# Patient Record
Sex: Female | Born: 1940 | Race: White | Hispanic: No | Marital: Married | Smoking: Never smoker
Health system: Southern US, Community
[De-identification: ages and names within clinical notes are randomized; demographics above are authoritative.]

## PROBLEM LIST (undated history)

## (undated) DIAGNOSIS — F0390 Unspecified dementia without behavioral disturbance: Secondary | ICD-10-CM

## (undated) DIAGNOSIS — I10 Essential (primary) hypertension: Secondary | ICD-10-CM

## (undated) DIAGNOSIS — E785 Hyperlipidemia, unspecified: Secondary | ICD-10-CM

## (undated) DIAGNOSIS — N83209 Unspecified ovarian cyst, unspecified side: Secondary | ICD-10-CM

## (undated) DIAGNOSIS — R945 Abnormal results of liver function studies: Secondary | ICD-10-CM

## (undated) DIAGNOSIS — K219 Gastro-esophageal reflux disease without esophagitis: Secondary | ICD-10-CM

## (undated) DIAGNOSIS — F419 Anxiety disorder, unspecified: Secondary | ICD-10-CM

## (undated) DIAGNOSIS — R51 Headache: Secondary | ICD-10-CM

## (undated) DIAGNOSIS — G2581 Restless legs syndrome: Secondary | ICD-10-CM

## (undated) DIAGNOSIS — R519 Headache, unspecified: Secondary | ICD-10-CM

## (undated) HISTORY — PX: FOOT SURGERY: SHX648

## (undated) HISTORY — DX: Anxiety disorder, unspecified: F41.9

## (undated) HISTORY — DX: Restless legs syndrome: G25.81

## (undated) HISTORY — PX: TUBAL LIGATION: SHX77

## (undated) HISTORY — DX: Gastro-esophageal reflux disease without esophagitis: K21.9

## (undated) HISTORY — DX: Abnormal results of liver function studies: R94.5

## (undated) HISTORY — DX: Headache, unspecified: R51.9

## (undated) HISTORY — DX: Headache: R51

## (undated) HISTORY — DX: Unspecified ovarian cyst, unspecified side: N83.209

## (undated) HISTORY — DX: Hyperlipidemia, unspecified: E78.5

---

## 1999-03-20 ENCOUNTER — Other Ambulatory Visit: Admission: RE | Admit: 1999-03-20 | Discharge: 1999-03-20 | Payer: Self-pay | Admitting: Obstetrics and Gynecology

## 1999-12-24 ENCOUNTER — Encounter: Payer: Self-pay | Admitting: *Deleted

## 1999-12-24 ENCOUNTER — Encounter: Admission: RE | Admit: 1999-12-24 | Discharge: 1999-12-24 | Payer: Self-pay | Admitting: *Deleted

## 2000-02-02 ENCOUNTER — Other Ambulatory Visit: Admission: RE | Admit: 2000-02-02 | Discharge: 2000-02-02 | Payer: Self-pay | Admitting: Obstetrics and Gynecology

## 2000-02-02 ENCOUNTER — Encounter (INDEPENDENT_AMBULATORY_CARE_PROVIDER_SITE_OTHER): Payer: Self-pay

## 2000-02-03 ENCOUNTER — Encounter: Admission: RE | Admit: 2000-02-03 | Discharge: 2000-05-03 | Payer: Self-pay | Admitting: *Deleted

## 2001-02-23 ENCOUNTER — Other Ambulatory Visit: Admission: RE | Admit: 2001-02-23 | Discharge: 2001-02-23 | Payer: Self-pay | Admitting: Obstetrics and Gynecology

## 2001-02-25 ENCOUNTER — Encounter: Payer: Self-pay | Admitting: Obstetrics and Gynecology

## 2001-02-25 ENCOUNTER — Encounter: Admission: RE | Admit: 2001-02-25 | Discharge: 2001-02-25 | Payer: Self-pay | Admitting: Obstetrics and Gynecology

## 2001-04-01 ENCOUNTER — Emergency Department (HOSPITAL_COMMUNITY): Admission: EM | Admit: 2001-04-01 | Discharge: 2001-04-01 | Payer: Self-pay | Admitting: Emergency Medicine

## 2002-03-06 ENCOUNTER — Other Ambulatory Visit: Admission: RE | Admit: 2002-03-06 | Discharge: 2002-03-06 | Payer: Self-pay | Admitting: Obstetrics and Gynecology

## 2002-04-11 ENCOUNTER — Encounter: Payer: Self-pay | Admitting: Orthopedic Surgery

## 2002-04-11 ENCOUNTER — Encounter: Admission: RE | Admit: 2002-04-11 | Discharge: 2002-04-11 | Payer: Self-pay | Admitting: Orthopedic Surgery

## 2003-02-27 ENCOUNTER — Encounter: Payer: Self-pay | Admitting: Internal Medicine

## 2003-02-27 ENCOUNTER — Encounter: Admission: RE | Admit: 2003-02-27 | Discharge: 2003-02-27 | Payer: Self-pay | Admitting: Internal Medicine

## 2003-11-18 ENCOUNTER — Emergency Department (HOSPITAL_COMMUNITY): Admission: EM | Admit: 2003-11-18 | Discharge: 2003-11-18 | Payer: Self-pay | Admitting: Family Medicine

## 2003-11-23 ENCOUNTER — Ambulatory Visit (HOSPITAL_BASED_OUTPATIENT_CLINIC_OR_DEPARTMENT_OTHER): Admission: RE | Admit: 2003-11-23 | Discharge: 2003-11-23 | Payer: Self-pay | Admitting: Orthopedic Surgery

## 2004-03-04 ENCOUNTER — Other Ambulatory Visit: Admission: RE | Admit: 2004-03-04 | Discharge: 2004-03-04 | Payer: Self-pay | Admitting: Internal Medicine

## 2004-06-11 ENCOUNTER — Ambulatory Visit: Payer: Self-pay | Admitting: Internal Medicine

## 2005-04-17 ENCOUNTER — Ambulatory Visit: Payer: Self-pay | Admitting: Internal Medicine

## 2005-04-20 ENCOUNTER — Ambulatory Visit: Payer: Self-pay | Admitting: Family Medicine

## 2005-04-24 ENCOUNTER — Ambulatory Visit: Payer: Self-pay | Admitting: Internal Medicine

## 2005-06-10 ENCOUNTER — Ambulatory Visit: Payer: Self-pay | Admitting: Internal Medicine

## 2005-06-15 ENCOUNTER — Ambulatory Visit: Payer: Self-pay | Admitting: Internal Medicine

## 2005-08-11 ENCOUNTER — Encounter: Admission: RE | Admit: 2005-08-11 | Discharge: 2005-08-11 | Payer: Self-pay | Admitting: Internal Medicine

## 2006-02-18 ENCOUNTER — Ambulatory Visit: Payer: Self-pay | Admitting: Internal Medicine

## 2006-02-24 ENCOUNTER — Ambulatory Visit: Payer: Self-pay | Admitting: Internal Medicine

## 2006-04-27 ENCOUNTER — Ambulatory Visit: Payer: Self-pay | Admitting: Internal Medicine

## 2006-05-17 ENCOUNTER — Ambulatory Visit: Payer: Self-pay | Admitting: Internal Medicine

## 2006-08-10 ENCOUNTER — Ambulatory Visit: Payer: Self-pay | Admitting: Internal Medicine

## 2006-08-10 LAB — CONVERTED CEMR LAB
BUN: 17 mg/dL (ref 6–23)
CO2: 30 meq/L (ref 19–32)
Chloride: 105 meq/L (ref 96–112)
Creatinine, Ser: 0.9 mg/dL (ref 0.4–1.2)
HCT: 39 % (ref 36.0–46.0)
RDW: 12.7 % (ref 11.5–14.6)
WBC: 5.6 10*3/uL (ref 4.5–10.5)

## 2006-08-12 ENCOUNTER — Encounter: Admission: RE | Admit: 2006-08-12 | Discharge: 2006-08-12 | Payer: Self-pay | Admitting: Internal Medicine

## 2006-08-30 ENCOUNTER — Ambulatory Visit: Payer: Self-pay | Admitting: Internal Medicine

## 2006-11-16 ENCOUNTER — Ambulatory Visit: Payer: Self-pay | Admitting: Internal Medicine

## 2006-11-16 LAB — CONVERTED CEMR LAB
Cholesterol: 184 mg/dL (ref 0–200)
HDL: 77.6 mg/dL (ref >39.0)
LDL Cholesterol: 88 mg/dL (ref 0–99)
Total CHOL/HDL Ratio: 2.4
Triglycerides: 93 mg/dL (ref 0–149)
VLDL: 19 mg/dL (ref 0–40)

## 2007-03-23 ENCOUNTER — Telehealth: Payer: Self-pay | Admitting: Internal Medicine

## 2007-04-18 DIAGNOSIS — R51 Headache: Secondary | ICD-10-CM

## 2007-04-18 DIAGNOSIS — R519 Headache, unspecified: Secondary | ICD-10-CM | POA: Insufficient documentation

## 2007-04-22 ENCOUNTER — Ambulatory Visit: Payer: Self-pay | Admitting: Internal Medicine

## 2007-04-22 DIAGNOSIS — F411 Generalized anxiety disorder: Secondary | ICD-10-CM

## 2007-04-22 DIAGNOSIS — G2581 Restless legs syndrome: Secondary | ICD-10-CM

## 2007-04-22 DIAGNOSIS — E782 Mixed hyperlipidemia: Secondary | ICD-10-CM

## 2007-04-22 LAB — CONVERTED CEMR LAB
Bilirubin Urine: NEGATIVE
Ketones, urine, test strip: NEGATIVE
Protein, U semiquant: NEGATIVE
Specific Gravity, Urine: 1.01
WBC Urine, dipstick: NEGATIVE
pH: 6.5

## 2007-04-25 ENCOUNTER — Encounter: Payer: Self-pay | Admitting: Internal Medicine

## 2007-05-04 LAB — CONVERTED CEMR LAB
ALT: 37 units/L — ABNORMAL HIGH (ref 0–35)
AST: 41 units/L — ABNORMAL HIGH (ref 0–37)
Albumin: 4.1 g/dL (ref 3.5–5.2)
Alkaline Phosphatase: 41 units/L (ref 39–117)
BUN: 15 mg/dL (ref 6–23)
Basophils Relative: 0.9 % (ref 0.0–1.0)
CO2: 32 meq/L (ref 19–32)
Chloride: 108 meq/L (ref 96–112)
Creatinine, Ser: 0.8 mg/dL (ref 0.4–1.2)
Ferritin: 28 ng/mL (ref 10.0–291.0)
HCT: 35.7 % — ABNORMAL LOW (ref 36.0–46.0)
Hemoglobin: 12.1 g/dL (ref 12.0–15.0)
Monocytes Absolute: 0.5 10*3/uL (ref 0.2–0.7)
Monocytes Relative: 10.8 % (ref 3.0–11.0)
Neutrophils Relative %: 58.6 % (ref 43.0–77.0)
Potassium: 5.5 meq/L — ABNORMAL HIGH (ref 3.5–5.1)
RBC: 3.88 M/uL (ref 3.87–5.11)
RDW: 13.1 % (ref 11.5–14.6)
Total Bilirubin: 0.6 mg/dL (ref 0.3–1.2)
Total CHOL/HDL Ratio: 2.5
Total Protein: 6.3 g/dL (ref 6.0–8.3)
Triglycerides: 67 mg/dL (ref 0–149)
VLDL: 13 mg/dL (ref 0–40)

## 2007-05-06 ENCOUNTER — Ambulatory Visit: Payer: Self-pay | Admitting: Internal Medicine

## 2007-05-06 ENCOUNTER — Encounter: Payer: Self-pay | Admitting: Family Medicine

## 2007-05-13 ENCOUNTER — Encounter: Payer: Self-pay | Admitting: Internal Medicine

## 2007-07-28 ENCOUNTER — Ambulatory Visit: Payer: Self-pay | Admitting: Internal Medicine

## 2007-07-28 LAB — CONVERTED CEMR LAB
HCV Ab: NEGATIVE
Hepatitis B Surface Ag: NEGATIVE

## 2007-07-31 LAB — CONVERTED CEMR LAB
BUN: 15 mg/dL (ref 6–23)
Cholesterol: 227 mg/dL (ref 0–200)
Direct LDL: 119.9 mg/dL
GFR calc Af Amer: 92 mL/min
GFR calc non Af Amer: 76 mL/min
HDL: 88.9 mg/dL (ref >39.0)
Potassium: 4.6 meq/L (ref 3.5–5.1)
Sodium: 143 meq/L (ref 135–145)
Total CHOL/HDL Ratio: 2.6
Triglycerides: 60 mg/dL (ref 0–149)
VLDL: 12 mg/dL (ref 0–40)

## 2007-08-04 ENCOUNTER — Ambulatory Visit: Payer: Self-pay | Admitting: Internal Medicine

## 2007-08-04 DIAGNOSIS — K219 Gastro-esophageal reflux disease without esophagitis: Secondary | ICD-10-CM

## 2007-08-04 DIAGNOSIS — R7309 Other abnormal glucose: Secondary | ICD-10-CM

## 2007-08-04 DIAGNOSIS — R945 Abnormal results of liver function studies: Secondary | ICD-10-CM

## 2007-08-31 ENCOUNTER — Encounter: Payer: Self-pay | Admitting: Internal Medicine

## 2007-10-04 ENCOUNTER — Ambulatory Visit: Payer: Self-pay | Admitting: Internal Medicine

## 2007-10-04 DIAGNOSIS — R7401 Elevation of levels of liver transaminase levels: Secondary | ICD-10-CM | POA: Insufficient documentation

## 2007-10-04 DIAGNOSIS — R740 Nonspecific elevation of levels of transaminase and lactic acid dehydrogenase [LDH]: Secondary | ICD-10-CM

## 2007-10-10 LAB — CONVERTED CEMR LAB
ALT: 41 units/L — ABNORMAL HIGH (ref 0–35)
AST: 39 units/L — ABNORMAL HIGH (ref 0–37)
Bilirubin, Direct: 0.1 mg/dL (ref 0.0–0.3)
Total Bilirubin: 0.9 mg/dL (ref 0.3–1.2)

## 2007-10-11 ENCOUNTER — Ambulatory Visit: Payer: Self-pay | Admitting: Internal Medicine

## 2007-10-11 DIAGNOSIS — L538 Other specified erythematous conditions: Secondary | ICD-10-CM | POA: Insufficient documentation

## 2007-11-14 ENCOUNTER — Encounter: Admission: RE | Admit: 2007-11-14 | Discharge: 2007-11-14 | Payer: Self-pay | Admitting: Internal Medicine

## 2007-11-29 ENCOUNTER — Ambulatory Visit: Payer: Self-pay | Admitting: Internal Medicine

## 2007-11-29 LAB — CONVERTED CEMR LAB: Ceruloplasmin: 37 mg/dL (ref 21–63)

## 2007-12-05 LAB — CONVERTED CEMR LAB
AST: 33 units/L (ref 0–37)
Albumin: 4 g/dL (ref 3.5–5.2)
Ferritin: 20.3 ng/mL (ref 10.0–291.0)
Iron: 114 ug/dL (ref 42–145)
Total Bilirubin: 0.7 mg/dL (ref 0.3–1.2)
Transferrin: 316.9 mg/dL (ref >=212.0)

## 2007-12-06 ENCOUNTER — Ambulatory Visit: Payer: Self-pay | Admitting: Internal Medicine

## 2008-01-06 ENCOUNTER — Telehealth: Payer: Self-pay | Admitting: *Deleted

## 2008-02-22 ENCOUNTER — Ambulatory Visit: Payer: Self-pay | Admitting: Internal Medicine

## 2008-02-24 LAB — CONVERTED CEMR LAB
AST: 34 units/L (ref 0–37)
Bilirubin, Direct: 0.1 mg/dL (ref 0.0–0.3)
Direct LDL: 119.5 mg/dL
HDL: 71.5 mg/dL (ref >39.0)
Total Bilirubin: 0.7 mg/dL (ref 0.3–1.2)
Total CHOL/HDL Ratio: 3.1
Triglycerides: 105 mg/dL (ref 0–149)

## 2008-03-10 ENCOUNTER — Ambulatory Visit: Payer: Self-pay | Admitting: Family Medicine

## 2008-04-09 ENCOUNTER — Telehealth: Payer: Self-pay | Admitting: Internal Medicine

## 2008-05-16 ENCOUNTER — Encounter: Payer: Self-pay | Admitting: Internal Medicine

## 2008-05-22 ENCOUNTER — Telehealth: Payer: Self-pay | Admitting: Internal Medicine

## 2008-05-24 ENCOUNTER — Telehealth: Payer: Self-pay | Admitting: Internal Medicine

## 2008-06-12 ENCOUNTER — Telehealth: Payer: Self-pay | Admitting: Internal Medicine

## 2008-07-02 ENCOUNTER — Ambulatory Visit: Payer: Self-pay | Admitting: Internal Medicine

## 2008-07-23 ENCOUNTER — Telehealth: Payer: Self-pay | Admitting: Internal Medicine

## 2008-08-31 ENCOUNTER — Telehealth: Payer: Self-pay | Admitting: Internal Medicine

## 2008-09-03 ENCOUNTER — Encounter: Payer: Self-pay | Admitting: Internal Medicine

## 2008-09-05 ENCOUNTER — Encounter: Payer: Self-pay | Admitting: Internal Medicine

## 2008-10-12 ENCOUNTER — Ambulatory Visit: Payer: Self-pay | Admitting: Internal Medicine

## 2008-10-15 LAB — CONVERTED CEMR LAB
AST: 36 units/L (ref 0–37)
Alkaline Phosphatase: 47 units/L (ref 39–117)
BUN: 14 mg/dL (ref 6–23)
Basophils Relative: 0.7 % (ref 0.0–3.0)
Bilirubin, Direct: 0 mg/dL (ref 0.0–0.3)
CO2: 31 meq/L (ref 19–32)
Calcium: 9.4 mg/dL (ref 8.4–10.5)
Chloride: 106 meq/L (ref 96–112)
Creatinine, Ser: 0.9 mg/dL (ref 0.4–1.2)
Direct LDL: 117.4 mg/dL
Eosinophils Relative: 5.2 % — ABNORMAL HIGH (ref 0.0–5.0)
Glucose, Bld: 99 mg/dL (ref 70–99)
HCT: 38.6 % (ref 36.0–46.0)
Hemoglobin: 13.1 g/dL (ref 12.0–15.0)
Lymphs Abs: 1 10*3/uL (ref 0.7–4.0)
Monocytes Relative: 11.8 % (ref 3.0–12.0)
Neutro Abs: 2.2 10*3/uL (ref 1.4–7.7)
Platelets: 298 10*3/uL (ref 150.0–400.0)
RBC: 4.14 M/uL (ref 3.87–5.11)
TSH: 1.24 microintl units/mL (ref 0.35–5.50)
Total CHOL/HDL Ratio: 2
WBC: 3.8 10*3/uL — ABNORMAL LOW (ref 4.5–10.5)

## 2009-01-02 ENCOUNTER — Encounter: Payer: Self-pay | Admitting: Internal Medicine

## 2009-03-28 ENCOUNTER — Ambulatory Visit: Payer: Self-pay | Admitting: Internal Medicine

## 2009-03-28 DIAGNOSIS — M7989 Other specified soft tissue disorders: Secondary | ICD-10-CM

## 2009-04-10 ENCOUNTER — Telehealth: Payer: Self-pay | Admitting: Internal Medicine

## 2009-04-17 ENCOUNTER — Ambulatory Visit: Payer: Self-pay | Admitting: Internal Medicine

## 2009-04-17 LAB — CONVERTED CEMR LAB
ALT: 43 units/L — ABNORMAL HIGH (ref 0–35)
AST: 37 units/L (ref 0–37)
Albumin: 4 g/dL (ref 3.5–5.2)
Alkaline Phosphatase: 48 units/L (ref 39–117)

## 2009-04-24 ENCOUNTER — Encounter: Payer: Self-pay | Admitting: Internal Medicine

## 2009-04-24 ENCOUNTER — Other Ambulatory Visit: Admission: RE | Admit: 2009-04-24 | Discharge: 2009-04-24 | Payer: Self-pay | Admitting: Internal Medicine

## 2009-04-24 ENCOUNTER — Ambulatory Visit: Payer: Self-pay | Admitting: Internal Medicine

## 2009-05-23 ENCOUNTER — Ambulatory Visit: Payer: Self-pay | Admitting: Internal Medicine

## 2009-08-12 ENCOUNTER — Encounter: Payer: Self-pay | Admitting: Internal Medicine

## 2009-08-26 ENCOUNTER — Encounter: Payer: Self-pay | Admitting: Internal Medicine

## 2009-08-26 ENCOUNTER — Ambulatory Visit: Payer: Self-pay | Admitting: Internal Medicine

## 2009-09-12 ENCOUNTER — Encounter: Payer: Self-pay | Admitting: Internal Medicine

## 2009-09-18 ENCOUNTER — Ambulatory Visit: Payer: Self-pay | Admitting: Internal Medicine

## 2009-09-18 LAB — CONVERTED CEMR LAB
AST: 34 units/L (ref 0–37)
Albumin: 4 g/dL (ref 3.5–5.2)

## 2009-09-25 ENCOUNTER — Ambulatory Visit: Payer: Self-pay | Admitting: Internal Medicine

## 2009-10-03 ENCOUNTER — Telehealth: Payer: Self-pay | Admitting: Internal Medicine

## 2009-12-23 ENCOUNTER — Telehealth: Payer: Self-pay | Admitting: Internal Medicine

## 2010-03-26 ENCOUNTER — Ambulatory Visit: Payer: Self-pay | Admitting: Internal Medicine

## 2010-03-26 LAB — CONVERTED CEMR LAB
ALT: 30 units/L (ref 0–35)
BUN: 18 mg/dL (ref 6–23)
CO2: 30 meq/L (ref 19–32)
Chloride: 103 meq/L (ref 96–112)
Cholesterol: 251 mg/dL — ABNORMAL HIGH (ref 0–200)
Creatinine, Ser: 0.8 mg/dL (ref 0.4–1.2)
Direct LDL: 154.1 mg/dL
TSH: 2.34 microintl units/mL (ref 0.35–5.50)
Total Bilirubin: 0.7 mg/dL (ref 0.3–1.2)
Total Protein: 6.4 g/dL (ref 6.0–8.3)
Triglycerides: 107 mg/dL (ref 0.0–149.0)
VLDL: 21.4 mg/dL (ref 0.0–40.0)

## 2010-04-02 ENCOUNTER — Ambulatory Visit: Payer: Self-pay | Admitting: Internal Medicine

## 2010-06-04 ENCOUNTER — Ambulatory Visit: Payer: Self-pay | Admitting: Internal Medicine

## 2010-06-06 LAB — CONVERTED CEMR LAB
ALT: 32 units/L (ref 0–35)
Alkaline Phosphatase: 39 units/L (ref 39–117)
Bilirubin, Direct: 0.1 mg/dL (ref 0.0–0.3)
Total Protein: 6 g/dL (ref 6.0–8.3)

## 2010-08-19 NOTE — Progress Notes (Signed)
Summary: tongue lumpy  Phone Note Call from Patient Call back at Home Phone [phone]   Caller: Patient Summary of Call: Pt called saying that on 5/31 or 6/1 she bit her tongue and it swelled up. Then she did it again. It is healing in a odd lumpy way. She is wanting to know what she should do. It is still swollen but it looks like it is healing in a funny way. She states that her teeth went into the top layer of her tongue. It bleed some. It is tender.  Initial call taken by: Romualdo Bolk, CMA Duncan Dull),  December 23, 2009 12:40 PM  Follow-up for Phone Call        Per- Dr. Fabian Sharp- have her call her dentist to see if they can see her otherwise can see her tomorrow. Follow-up by: Romualdo Bolk, CMA (AAMA),  December 23, 2009 12:43 PM

## 2010-08-19 NOTE — Assessment & Plan Note (Signed)
Summary: fup labs//ccm   Vital Signs:  Patient profile:   70 year old female Menstrual status:  postmenopausal Weight:      141 pounds Pulse rate:   72 / minute BP sitting:   112 / 70  (left arm) Cuff size:   regular  Vitals Entered By: Romualdo Bolk, CMA (AAMA) (April 02, 2010 9:44 AM) CC: Follow-up visit on labs   History of Present Illness: Cynthia Floyd comes in today  for follow up of multiple medical problems . Since last visit  here  there have been no major changes in health status  .  LIPIDS: NO se of meds  could eat a bit better  Allergies :  stable   Mood/ anxiety :  now taking 30 mg   celexa for few weeks.    and doing better .   less chest pressure . needs refillof tranxene only using as needed.  GERD: stable   Preventive Screening-Counseling & Management  Alcohol-Tobacco     Alcohol drinks/day: <1     Smoking Status: never  Caffeine-Diet-Exercise     Caffeine use/day: 4     Does Patient Exercise: no  Current Medications (verified): 1)  Clorazepate Dipotassium 7.5 Mg Tabs (Clorazepate Dipotassium) .Marland Kitchen.. 1-2 By Mouth Once Daily As Needed 2)  Fluticasone Propionate 50 Mcg/act Susp (Fluticasone Propionate) .Marland Kitchen.. 1-2 Sprays in Each Nostril Daily 3)  Protonix 40 Mg  Tbec (Pantoprazole Sodium) .Marland Kitchen.. 1 By Mouth Once Daily Prn 4)  Mevacor 20 Mg  Tabs (Lovastatin) .Marland Kitchen.. 1 By Mouth Once Daily 5)  Citalopram Hydrobromide 10 Mg Tabs (Citalopram Hydrobromide) .... 2 By Mouth Once Daily But Has Increased To 3 Once Daily  Allergies (verified): 1)  Aleve (Naproxen Sodium) 2)  Aspirin (Aspirin) 3)  * Bextra (Valdecoxib) 4)  Ibuprofen (Ibuprofen)  Past History:  Past medical, surgical, family and social histories (including risk factors) reviewed, and no changes noted (except as noted below).  Past Medical History: Reviewed history from 10/12/2008 and no changes required. High cholesterol Headaches Anxiety RLS Hyperlipidemia GERD  REmote hx of ovarian cyst.     MRI Head 1/08  and wnl  DT 2004 Dexa 2006  EKG 2006  Past Surgical History: Reviewed history from 10/12/2008 and no changes required. CB x2 Bil foot Surgery Colonoscopy 2003 Tubal ligation  Past History:  Care Management: Psychiatry: Nolen Mu  Orthopedics: Madelon Lips  Dermatology: Venancio Poisson   Family History: Reviewed history from 07/02/2008 and no changes required. Family History of Arthritis Family History Diabetes 1st degree relative FA Family History Hypertension Family History Kidney disease Family History of Melanoma Family History Other cancer - Pancreatic MOM Family History of Cardiovascular disorder FA Family History High cholesterol  neg for liver disease   Social History: Reviewed history from 10/12/2008 and no changes required. Retired Married Never Smoked Alcohol use-yes   2 night    Drug use-no Regular exercise-no  cats  Review of Systems  The patient denies anorexia, fever, weight loss, weight gain, vision loss, decreased hearing, hoarseness, syncope, dyspnea on exertion, peripheral edema, prolonged cough, abdominal pain, severe indigestion/heartburn, transient blindness, difficulty walking, depression, abnormal bleeding, enlarged lymph nodes, and angioedema.         ocass chest pressure felt to be anxiety  better with celexa   Physical Exam  General:  Well-developed,well-nourished,in no acute distress; alert,appropriate and cooperative throughout examination Head:  normocephalic and atraumatic.   Eyes:  vision grossly intact, pupils equal, and pupils round.   Ears:  no  external deformities.   Neck:  No deformities, masses, or tenderness noted. Lungs:  Normal respiratory effort, chest expands symmetrically. Lungs are clear to auscultation, no crackles or wheezes. Heart:  Normal rate and regular rhythm. S1 and S2 normal without gallop, murmur, click, rub or other extra sounds. Abdomen:  Bowel sounds positive,abdomen soft and non-tender without  masses, organomegaly or   noted. Msk:  no joint warmth and no redness over joints.   Pulses:  pulses intact without delay   Extremities:  no clubbing cyanosis or edema  Neurologic:  Pt is A&Ox3,affect,speech,memory,attention,&motor skills appear intact.  Skin:  turgor normal and color normal.   Cervical Nodes:  No lymphadenopathy noted Psych:  Oriented X3, good eye contact, and not depressed appearing.  not anxious appearing . nl cognition   Impression & Recommendations:  Problem # 1:  HYPERLIPIDEMIA (ICD-272.2)  could be better  no se of meds  ok to give a trial of higher dose if tolerates .  ratio is pretty good.  Her updated medication list for this problem includes:    Mevacor 20 Mg Tabs (Lovastatin) .Marland Kitchen... 1 by mouth once daily    Lovastatin 40 Mg Tabs (Lovastatin) .Marland Kitchen... Take   1 by mouth once daily or as directed  Labs Reviewed: SGOT: 32 (03/26/2010)   SGPT: 30 (03/26/2010)  Lipid Goals: Chol Goal: 200 (04/22/2007)   HDL Goal: 40 (04/22/2007)   LDL Goal: 160 (04/22/2007)   TG Goal: 150 (04/22/2007)  Prior 10 Yr Risk Heart Disease: Not enough information (10/12/2008)   HDL:83.90 (03/26/2010), 97.30 (10/12/2008)  LDL:DEL (02/22/2008), DEL (07/28/2007)  Chol:251 (03/26/2010), 219 (10/12/2008)  Trig:107.0 (03/26/2010), 46.0 (10/12/2008)  Orders: Prescription Created Electronically [phone])  Problem # 2:  ANXIETY STATE NOS (ICD-300.00) better on  higher dose  celexa  managing Her updated medication list for this problem includes:    Clorazepate Dipotassium 7.5 Mg Tabs (Clorazepate dipotassium) .Marland Kitchen... 1-2 by mouth once daily as needed    Citalopram Hydrobromide 10 Mg Tabs (Citalopram hydrobromide) .Marland KitchenMarland KitchenMarland KitchenMarland Kitchen 3 by mouth once daily or as directed  Problem # 3:  HYPERGLYCEMIA (ICD-790.29) Assessment: Improved normal  Problem # 4:  FATTY LIVER DISEASE ON [place] 4/09 (ICD-571.8) lfts normal now.   Complete Medication List: 1)  Clorazepate Dipotassium 7.5 Mg Tabs (Clorazepate dipotassium)  .Marland Kitchen.. 1-2 by mouth once daily as needed 2)  Fluticasone Propionate 50 Mcg/act Susp (Fluticasone propionate) .Marland Kitchen.. 1-2 sprays in each nostril daily 3)  Protonix 40 Mg Tbec (Pantoprazole sodium) .Marland Kitchen.. 1 by mouth once daily prn 4)  Mevacor 20 Mg Tabs (Lovastatin) .Marland Kitchen.. 1 by mouth once daily 5)  Citalopram Hydrobromide 10 Mg Tabs (Citalopram hydrobromide) .... 3 by mouth once daily or as directed 6)  Lovastatin 40 Mg Tabs (Lovastatin) .... Take   1 by mouth once daily or as directed  Other Orders: Admin 1st Vaccine (29562) Flu Vaccine 7yrs + (13086)  Patient Instructions: 1)  increase celexa to 30 mg  per day 2)  inc lovastatin to 40 per day  3)  lipids liver in 2-3 months of ik no ov needed. dx 272.4 4)  Medicare wellness visit in 6 months    5)  call for refills in the meantime Prescriptions: LOVASTATIN 40 MG TABS (LOVASTATIN) take   1 by mouth once daily or as directed  #30 x 6   Entered and Authorized by:   Madelin Headings MD   Signed by:   Madelin Headings MD on 04/02/2010   Method used:  Electronically to        [facility] [phone]* (retail)       [address]       [place], [place]  [address]       Ph: [phone] or [phone]       Fax: [phone]   RxID:   [id number] CLORAZEPATE DIPOTASSIUM 7.5 MG TABS (CLORAZEPATE DIPOTASSIUM) 1-2 by mouth once daily as needed  #60 x 1   Entered and Authorized by:   Madelin Headings MD   Signed by:   Madelin Headings MD on 04/02/2010   Method used:   Print then Give to Patient   RxID:   [id number] CITALOPRAM HYDROBROMIDE 10 MG TABS (CITALOPRAM HYDROBROMIDE) 3 by mouth once daily or as directed  #90 x 5   Entered and Authorized by:   Madelin Headings MD   Signed by:   Madelin Headings MD on 04/02/2010   Method used:   Electronically to        [facility] [phone]* (retail)       [address]       [place], [place]  [address]       Ph: [phone] or [phone]       Fax: [phone]   RxID:   [id number]  Flu Vaccine Consent  Questions     Do you have a history of severe allergic reactions to this vaccine? no    Any prior history of allergic reactions to egg and/or gelatin? no    Do you have a sensitivity to the preservative Thimersol? no    Do you have a past history of Guillan-Barre Syndrome? no    Do you currently have an acute febrile illness? no    Have you ever had a severe reaction to latex? no    Vaccine information given and explained to patient? yes    Are you currently pregnant? no    Lot Number:[id number]   Exp Date:01/17/2011   Site Given  Left Deltoid IMlu Romualdo Bolk, CMA (AAMA)  April 02, 2010 9:49 AM

## 2010-08-19 NOTE — Assessment & Plan Note (Signed)
Summary: 4 MNTH ROV//SLM   Vital Signs:  Patient profile:   70 year old female Menstrual status:  postmenopausal Height:      65.5 inches Weight:      147 pounds BMI:     24.18 Pulse rate:   78 / minute BP sitting:   120 / 80  (left arm) Cuff size:   regular  Vitals Entered By: Romualdo Bolk, CMA (AAMA) (September 25, 2009 9:59 AM) CC: Follow-up visit on labs   History of Present Illness: Cynthia Floyd comesin today for   for  above   lab  and meds and ongoing medical conditions.  Since last visit she has done pretty well.  Mood  :   anxiety.    has increase to 2 by mouth once daily   taking as needed  chlorazepate.    controlled.  Mammo and dexa are good.  No other change in health status  traveling with family obligations without sig problem  Liver:  NO excess alcohol or meds of tylenol or nsaids. LIPIDs;NO se of meds  Granuloma annulare  reasonably controlled .     Preventive Screening-Counseling & Management  Alcohol-Tobacco     Alcohol drinks/day: <1     Smoking Status: never  Caffeine-Diet-Exercise     Caffeine use/day: 4     Does Patient Exercise: no  Current Medications (verified): 1)  Clorazepate Dipotassium 7.5 Mg Tabs (Clorazepate Dipotassium) .Marland Kitchen.. 1-2 By Mouth Once Daily As Needed 2)  Fluticasone Propionate 50 Mcg/act Susp (Fluticasone Propionate) .Marland Kitchen.. 1-2 Sprays in Each Nostril Daily 3)  Protonix 40 Mg  Tbec (Pantoprazole Sodium) .Marland Kitchen.. 1 By Mouth Once Daily Prn 4)  Mevacor 20 Mg  Tabs (Lovastatin) .Marland Kitchen.. 1 By Mouth Once Daily 5)  Citalopram Hydrobromide 10 Mg Tabs (Citalopram Hydrobromide) .Marland Kitchen.. 1 By Mouth Once Daily  Can Increase To 2 By Mouth Once Daily After 2-3 Weeks or As Directed  Allergies (verified): 1)  Aleve (Naproxen Sodium) 2)  Aspirin (Aspirin) 3)  * Bextra (Valdecoxib) 4)  Ibuprofen (Ibuprofen)  Past History:  Past medical, surgical, family and social histories (including risk factors) reviewed, and no changes noted (except as noted  below).  Past Medical History: Reviewed history from 10/12/2008 and no changes required. High cholesterol Headaches Anxiety RLS Hyperlipidemia GERD  REmote hx of ovarian cyst.   MRI Head 1/08  and wnl  DT 2004 Dexa 2006  EKG 2006  Past Surgical History: Reviewed history from 10/12/2008 and no changes required. CB x2 Bil foot Surgery Colonoscopy 2003 Tubal ligation  Past History:  Care Management: Psychiatry: Nolen Mu  Orthopedics: Madelon Lips  Dermatology: Venancio Poisson   Family History: Reviewed history from 07/02/2008 and no changes required. Family History of Arthritis Family History Diabetes 1st degree relative FA Family History Hypertension Family History Kidney disease Family History of Melanoma Family History Other cancer - Pancreatic MOM Family History of Cardiovascular disorder FA Family History High cholesterol  neg for liver disease   Social History: Reviewed history from 10/12/2008 and no changes required. Retired Married Never Smoked Alcohol use-yes   2 night    Drug use-no Regular exercise-no  cats  Review of Systems  The patient denies anorexia, fever, weight loss, weight gain, hoarseness, syncope, dyspnea on exertion, peripheral edema, prolonged cough, headaches, abdominal pain, difficulty walking, depression, abnormal bleeding, enlarged lymph nodes, and angioedema.         rest of ros no change  Physical Exam  General:  Well-developed,well-nourished,in no acute distress; alert,appropriate  and cooperative throughout examination Head:  normocephalic and atraumatic.   Eyes:  vision grossly intact, pupils equal, and pupils round.   Neck:  No deformities, masses, or tenderness noted. Lungs:  Normal respiratory effort, chest expands symmetrically. Lungs are clear to auscultation, no crackles or wheezes.no dullness.   Heart:  Normal rate and regular rhythm. S1 and S2 normal without gallop, murmur, click, rub or other extra sounds.no lifts.     Abdomen:  Bowel sounds positive,abdomen soft and non-tender without masses, organomegaly or   noted. Extremities:  no clubbing cyanosis or edema  Neurologic:  non focal  Skin:  turgor normal and color normal.   Cervical Nodes:  No lymphadenopathy noted Psych:  Oriented X3, good eye contact, and not depressed appearing.  not anxious appearing . nl cognition   Impression & Recommendations:  Problem # 1:  LIVER FUNCTION TESTS, ABNORMAL (ICD-794.8) Assessment Improved will follow  hx of fatty liver on scan  Problem # 2:  HYPERLIPIDEMIA (ICD-272.2)  Her updated medication list for this problem includes:    Mevacor 20 Mg Tabs (Lovastatin) .Marland Kitchen... 1 by mouth once daily  Labs Reviewed: SGOT: 34 (09/18/2009)   SGPT: 33 (09/18/2009)  Lipid Goals: Chol Goal: 200 (04/22/2007)   HDL Goal: 40 (04/22/2007)   LDL Goal: 160 (04/22/2007)   TG Goal: 150 (04/22/2007)  Prior 10 Yr Risk Heart Disease: Not enough information (10/12/2008)   HDL:97.30 (10/12/2008), 71.5 (02/22/2008)  LDL:DEL (02/22/2008), DEL (07/28/2007)  Chol:219 (10/12/2008), 219 (02/22/2008)  Trig:46.0 (10/12/2008), 105 (02/22/2008)  Problem # 3:  ANXIETY STATE NOS (ICD-300.00) Assessment: Unchanged stable ok to stay on hiher dose of med  Her updated medication list for this problem includes:    Clorazepate Dipotassium 7.5 Mg Tabs (Clorazepate dipotassium) .Marland Kitchen... 1-2 by mouth once daily as needed    Citalopram Hydrobromide 10 Mg Tabs (Citalopram hydrobromide) .Marland Kitchen... 1 by mouth once daily  can increase to 2 by mouth once daily after 2-3 weeks or as directed  Complete Medication List: 1)  Clorazepate Dipotassium 7.5 Mg Tabs (Clorazepate dipotassium) .Marland Kitchen.. 1-2 by mouth once daily as needed 2)  Fluticasone Propionate 50 Mcg/act Susp (Fluticasone propionate) .Marland Kitchen.. 1-2 sprays in each nostril daily 3)  Protonix 40 Mg Tbec (Pantoprazole sodium) .Marland Kitchen.. 1 by mouth once daily prn 4)  Mevacor 20 Mg Tabs (Lovastatin) .Marland Kitchen.. 1 by mouth once daily 5)   Citalopram Hydrobromide 10 Mg Tabs (Citalopram hydrobromide) .Marland Kitchen.. 1 by mouth once daily  can increase to 2 by mouth once daily after 2-3 weeks or as directed  Patient Instructions: 1)  Please schedule a follow-up appointment in 6 months .  2)  BMP prior to visit, ICD-9:   272.2  790. 3)  Hepatic Panel prior to visit ICD-9:  4)  Lipid panel prior to visit ICD-9 :  5)  TSH prior to visit ICD-9 :   272.4  Prescriptions: CLORAZEPATE DIPOTASSIUM 7.5 MG TABS (CLORAZEPATE DIPOTASSIUM) 1-2 by mouth once daily as needed  #60 x 1   Entered and Authorized by:   Madelin Headings MD   Signed by:   Madelin Headings MD on 09/25/2009   Method used:   Print then Give to Patient   RxID:   [id number]

## 2010-08-19 NOTE — Medication Information (Signed)
Summary: Rx Coverage Approval/[employer]  Rx Coverage Approval/[employer]   Imported By: Maryln Gottron 08/15/2009 11:32:51  _____________________________________________________________________  External Attachment:    Type:   Image     Comment:   External Document

## 2010-08-19 NOTE — Miscellaneous (Signed)
Summary: BONE DENSITY  Clinical Lists Changes  Orders: Added new Test order of T-Bone Densitometry (77080) - Signed Added new Test order of T-Lumbar Vertebral Assessment (77082) - Signed 

## 2010-08-19 NOTE — Progress Notes (Signed)
Summary: FYI  Phone Note Call from Patient   Caller: Patient Call For: Madelin Headings MD Summary of Call: Pt. is calling for information on HPV vaccination.  Informed it is indiicated for femaies 9-26.  Discussed cervical cancer, and how HPV plays a part in this diagnosis.  Discussed high risk behavior and need for annual pap smears. Initial call taken by: Lynann Beaver CMA,  October 03, 2009 11:52 AM

## 2010-09-08 ENCOUNTER — Other Ambulatory Visit: Payer: Self-pay | Admitting: *Deleted

## 2010-09-08 NOTE — Telephone Encounter (Signed)
Last filled on 06/21/10 #60

## 2010-09-09 NOTE — Telephone Encounter (Signed)
Ok to refill  X 2  

## 2010-09-10 MED ORDER — CLORAZEPATE DIPOTASSIUM 7.5 MG PO TABS: ORAL_TABLET | ORAL | Status: DC

## 2010-09-10 NOTE — Telephone Encounter (Signed)
Rx faxed to pharmacy  

## 2010-10-02 ENCOUNTER — Encounter: Payer: Self-pay | Admitting: Internal Medicine

## 2010-10-06 ENCOUNTER — Ambulatory Visit (INDEPENDENT_AMBULATORY_CARE_PROVIDER_SITE_OTHER): Payer: Medicare Other | Admitting: Internal Medicine

## 2010-10-06 ENCOUNTER — Encounter: Payer: Self-pay | Admitting: Internal Medicine

## 2010-10-06 VITALS — BP 120/80 | HR 72 | Ht 65.0 in | Wt 148.0 lb

## 2010-10-06 DIAGNOSIS — G2581 Restless legs syndrome: Secondary | ICD-10-CM

## 2010-10-06 DIAGNOSIS — Z0000 Encounter for general adult medical examination without abnormal findings: Secondary | ICD-10-CM

## 2010-10-06 DIAGNOSIS — F17200 Nicotine dependence, unspecified, uncomplicated: Secondary | ICD-10-CM

## 2010-10-06 DIAGNOSIS — R7309 Other abnormal glucose: Secondary | ICD-10-CM

## 2010-10-06 DIAGNOSIS — Z136 Encounter for screening for cardiovascular disorders: Secondary | ICD-10-CM

## 2010-10-06 DIAGNOSIS — F411 Generalized anxiety disorder: Secondary | ICD-10-CM

## 2010-10-06 DIAGNOSIS — K219 Gastro-esophageal reflux disease without esophagitis: Secondary | ICD-10-CM

## 2010-10-06 DIAGNOSIS — E782 Mixed hyperlipidemia: Secondary | ICD-10-CM

## 2010-10-06 MED ORDER — FLUTICASONE PROPIONATE 50 MCG/ACT NA SUSP: 2.0000 | Freq: Every day | NASAL | Status: DC

## 2010-10-06 MED ORDER — CLORAZEPATE DIPOTASSIUM 7.5 MG PO TABS: ORAL_TABLET | ORAL | Status: DC

## 2010-10-06 MED ORDER — CITALOPRAM HYDROBROMIDE 10 MG PO TABS: 10.0000 mg | ORAL_TABLET | Freq: Every day | ORAL | Status: DC

## 2010-10-06 MED ORDER — LOVASTATIN 20 MG PO TABS: 20.0000 mg | ORAL_TABLET | Freq: Every day | ORAL | Status: DC

## 2010-10-06 MED ORDER — PANTOPRAZOLE SODIUM 40 MG PO TBEC: 40.0000 mg | DELAYED_RELEASE_TABLET | Freq: Every day | ORAL | Status: DC

## 2010-10-06 NOTE — Patient Instructions (Signed)
lifestyle intervention healthy eating and exercise . To help lipids and weight.

## 2010-10-06 NOTE — Progress Notes (Signed)
Subjective:    Cynthia Floyd is a 70 y.o. female who presents for a wellness Medicare exam. A nd fu of  multiple medical issues  No major change in health status since last visit . BP has been good on no meds  Anxiety up and down but controlled   Takes more meds when has guests.  LIPIDS: no se of meds ? Whether to take 20 or 40 of lovastatin GERD  Takes prilosec Cardiac risk factors: advanced age (older than 45 for men, 62 for women) and dyslipidemia.  Activities of Daily Living  In your present state of health, do you have any difficulty performing the following activities?:  Preparing food and eating?: No Bathing yourself: No Getting dressed: No Using the toilet:No Moving around from place to place: No In the past year have you fallen or had a near fall?:No  Current exercise habits: Home exercise routine includes treadmill, walking 1 hrs per day and Yard work.   Dietary issues discussed: gained weight   Eating   diffenerent . Likes ice cream  Hearing: little tinnitus   Vision:  Reading glasses  Touch of cataract   Safety: No falls .  Has smoke detector and wears seat belts.  No firearms. No excess sun exposure. Sees dentist regularly   Advance directive :  Reviewed   Memory: good     Depression Screen (Note: if answer to either of the following is "Yes", then a more complete depression screening is indicated)  Q1: Over the past two weeks, have you felt down, depressed or hopeless?no Q2: Over the past two weeks, have you felt little interest or pleasure in doing things? no   The following portions of the patient's history were reviewed and updated as appropriate: allergies, current medications, past family history, past medical history, past social history, past surgical history and problem list. Review of Systems A comprehensive review of systems was negative except for: Allergic/Immunologic: positive for hay fever   Minimal congestion seasonal .    Acid relatd   Stomach sx prn  : taking  protonix    No sx  eca for arthritis.   prn    Objective:     Vision by Snellen chart: right eye:, left eye:, Last done: 06/19/2010, Normal done by Dr. Emily Filbert Blood pressure 120/80, pulse 72, height 5\' 5"  (1.651 m), weight 148 lb (67.132 kg). Body mass index is 24.63 kg/(m^2). Physical Exam: Vital signs reviewed EAV:WUJW is a well-developed well-nourished alert cooperative  white female who appears her stated age in no acute distress.  HEENT: normocephalic  traumatic , Eyes: PERRL EOM's full, conjunctiva clear, Nares: patent no deformity discharge or tenderness. Mild congestion, Ears: no deformity EAC's clear TMs with normal landmarks. Mouth: clear OP, no lesions, edema.  Moist mucous membranes. Dentition in adequate repair. NECK: supple without masses, thyromegaly or bruits. CHEST/PULM:  Clear to auscultation and percussion breath sounds equal no wheeze , rales or rhonchi. No chest wall deformities or tenderness. CV: PMI is nondisplaced, S1 S2 no gallops, murmurs, rubs. Peripheral pulses are full without delay.No JVD .Breast: normal by inspection . No dimpling, discharge, masses, tenderness or discharge . ABDOMEN: Bowel sounds normal nontender  No guard or rebound, no hepato splenomegal no CVA tenderness.  No hernia. Extremtities:  No clubbing cyanosis or edema, no acute joint swelling or redness no focal atrophy NEURO:  Oriented x3, cranial nerves 3-12 appear to be intact, no obvious focal weakness,gait within normal limits no abnormal reflexes or asymmetrical  SKIN: No acute rashes normal turgor, color, no bruising or petechiae. PSYCH: Oriented, good eye contact, no obvious depression anxiety, cognition and judgment appear normal.minimally anxious today  Record  Prev EHR to review  EKGnsr   Lab Results  Component Value Date   CHOL 225* 06/04/2010   CHOL 251* 03/26/2010   CHOL 219* 10/12/2008  reviewed last labs    Assessment:  Medicare wellness LIPIDS  Had  been better   Ok to stay on lower dose orig tc was about 300 GERD  On meds  ANXIETY on celexa and prn chlorazapate no change  ALLERGIC rhinitis seasonal      Plan:     During the course of the visit the patient was educated and counseled about appropriate screening and preventive services including:   Pneumococcal vaccine   Influenza vaccine  Screening electrocardiogram  Colorectal cancer screening    Patient Instructions (the written plan) was given to the patient.  Refills meds  Take 20 of lova   Call if get se .

## 2010-10-09 ENCOUNTER — Encounter: Payer: Self-pay | Admitting: Internal Medicine

## 2010-11-24 ENCOUNTER — Ambulatory Visit (INDEPENDENT_AMBULATORY_CARE_PROVIDER_SITE_OTHER): Payer: Medicare Other | Admitting: Internal Medicine

## 2010-11-24 DIAGNOSIS — Z23 Encounter for immunization: Secondary | ICD-10-CM

## 2010-11-24 DIAGNOSIS — Z2911 Encounter for prophylactic immunotherapy for respiratory syncytial virus (RSV): Secondary | ICD-10-CM

## 2010-12-05 NOTE — Op Note (Signed)
NAME:  Cynthia Floyd, Cynthia Floyd                         ACCOUNT NO.:  [id number]   MEDICAL RECORD NO.:  [id number]                   PATIENT TYPE:  AMB   LOCATION:  [facility]                                  FACILITY:  [facility]   PHYSICIAN:  Thera Flake., M.D.             DATE OF BIRTH:  May 24, 1941   DATE OF PROCEDURE:  11/23/2003  DATE OF DISCHARGE:                                 OPERATIVE REPORT   INDICATIONS FOR PROCEDURE:  Painful hardware with actually an extruded and  basically backed-out screw, status post open reduction and internal  fixation, felt to be amenable to removal.  She was advised that the second  screw was in close proximity and could be removed without significant risk.   PREOPERATIVE DIAGNOSIS:  Retained hardware, right foot.   POSTOPERATIVE DIAGNOSIS:  Retained hardware, right foot.   OPERATION:  Removal of mini-fragment screw x2, right foot.   SURGEON:  Dyke Brackett, M.D.   ANESTHESIA:  Local standby with sedation.   TOURNIQUET TIME:  Approximately 50 minutes.   DESCRIPTION OF PROCEDURE:  A sterile prep and drape.  The foot was  exsanguinated only.  The tourniquet was inflated to 300 mmHg.  The distal  one-third of the incision was opened.  The distal screw was really in the  soft tissues and was removed over a thickened bursa.  The proximal screw was  easily removed within 1.0 cm of the first screw.  The wound was irrigated  and closed with nylon.  Prior to this it had been infiltrated with Marcaine  and Xylocaine.  The patient was taken to the recovery room in stable condition.                                               Thera Flake., M.D.    WDC/MEDQ  D:  11/23/2003  T:  11/24/2003  Job:  [id number]

## 2011-01-05 ENCOUNTER — Telehealth: Payer: Self-pay | Admitting: *Deleted

## 2011-01-05 NOTE — Telephone Encounter (Signed)
Ok to refill x 2  

## 2011-01-05 NOTE — Telephone Encounter (Signed)
Refill on clorazepate 7.5mg  last filled on 10/24/10 #60

## 2011-01-06 MED ORDER — CLORAZEPATE DIPOTASSIUM 7.5 MG PO TABS: ORAL_TABLET | ORAL | Status: DC

## 2011-01-06 NOTE — Telephone Encounter (Signed)
done

## 2011-03-24 ENCOUNTER — Encounter: Payer: Self-pay | Admitting: Internal Medicine

## 2011-03-24 ENCOUNTER — Ambulatory Visit (INDEPENDENT_AMBULATORY_CARE_PROVIDER_SITE_OTHER): Payer: Medicare Other | Admitting: Internal Medicine

## 2011-03-24 VITALS — BP 110/66 | HR 66 | Temp 98.2°F | Wt 145.0 lb

## 2011-03-24 DIAGNOSIS — H6690 Otitis media, unspecified, unspecified ear: Secondary | ICD-10-CM

## 2011-03-24 DIAGNOSIS — H9209 Otalgia, unspecified ear: Secondary | ICD-10-CM | POA: Insufficient documentation

## 2011-03-24 DIAGNOSIS — J019 Acute sinusitis, unspecified: Secondary | ICD-10-CM | POA: Insufficient documentation

## 2011-03-24 DIAGNOSIS — M546 Pain in thoracic spine: Secondary | ICD-10-CM

## 2011-03-24 DIAGNOSIS — H6692 Otitis media, unspecified, left ear: Secondary | ICD-10-CM

## 2011-03-24 MED ORDER — AMOXICILLIN-POT CLAVULANATE 875-125 MG PO TABS: 1.0000 | ORAL_TABLET | Freq: Two times a day (BID) | ORAL | Status: AC

## 2011-03-24 NOTE — Patient Instructions (Signed)
You have a middle  ear infection   On the left. Can take 5 days of antibiotic.Cynthia Floyd

## 2011-03-24 NOTE — Progress Notes (Signed)
  Subjective:    Patient ID: Cynthia Floyd, female    DOB: 1941-01-20, 70 y.o.   MRN: [id number]  Otalgia    Comes in for acute  Problem SDA   Onset with sore throat first  [place].  Took 2 asa and  Tylenol.  Hard to hear and  Popping and worse with sneezing.  [employer]. For 3 hours .  Ear pain  Onset  Coming back from mts .Then  Left. Ear pain suddenly hurt and decrease hearing continues. Pain down currently but has left face pressure. Ha minor cough no cp sob. No vision sx  Review of Systems  HENT: Positive for ear pain.   NO fever as above.   No sob rash vision change  Get intermittent  Mid thorax periscalpular  pain twingy and tingly at times  Will discuss at nex t visit .   Mom die of pancreatic cancer and has som concerns about this.  Past history family history social history reviewed in the electronic medical record.      Objective:   Physical Exam WDWN in nad  Looks a bit washed out.   But otherwise well. HEENT: Normocephalic ;atraumatic , Eyes;  PERRL, EOMs  Full, lids and conjunctiva clear,,Ears: no deformities, canals nl, TM tight  landmarks normal, left  Tm some dec lm  And pink and full  Nose: no deformity or discharge  Congested Mouth : OP clear without lesion or edema .  Mildly  Pink.   Left  Face a bit tenderness.   Neck no masses or adenopathy.   Chest:  Clear to A&P without wheezes rales or rhonchi CV:  S1-S2 no gallops or murmurs peripheral perfusion is normal    Assessment & Plan:  Ear pain acute onset in setting of congestion.   Acute Otitis media / Poss left sinusitis    Expectant management. An add antibiotic  potential se explained . will readdress intermittent thorac pain at wellness visit but still sounds like pinched nerve type pain.

## 2011-03-25 ENCOUNTER — Other Ambulatory Visit: Payer: BC Managed Care – PPO

## 2011-04-08 ENCOUNTER — Ambulatory Visit (INDEPENDENT_AMBULATORY_CARE_PROVIDER_SITE_OTHER): Payer: Medicare Other | Admitting: Internal Medicine

## 2011-04-08 ENCOUNTER — Telehealth: Payer: Self-pay | Admitting: *Deleted

## 2011-04-08 ENCOUNTER — Other Ambulatory Visit (INDEPENDENT_AMBULATORY_CARE_PROVIDER_SITE_OTHER): Payer: Medicare Other

## 2011-04-08 ENCOUNTER — Ambulatory Visit (INDEPENDENT_AMBULATORY_CARE_PROVIDER_SITE_OTHER)
Admission: RE | Admit: 2011-04-08 | Discharge: 2011-04-08 | Disposition: A | Discharge status: Home / Self Care | Payer: Medicare Other | Source: Ambulatory Visit | Attending: Internal Medicine | Admitting: Internal Medicine

## 2011-04-08 ENCOUNTER — Encounter: Payer: Self-pay | Admitting: Internal Medicine

## 2011-04-08 VITALS — BP 120/70 | HR 90 | Temp 98.5°F | Wt 141.0 lb

## 2011-04-08 DIAGNOSIS — H0289 Other specified disorders of eyelid: Secondary | ICD-10-CM

## 2011-04-08 DIAGNOSIS — J029 Acute pharyngitis, unspecified: Secondary | ICD-10-CM

## 2011-04-08 DIAGNOSIS — J069 Acute upper respiratory infection, unspecified: Secondary | ICD-10-CM | POA: Insufficient documentation

## 2011-04-08 DIAGNOSIS — H0259 Other disorders affecting eyelid function: Secondary | ICD-10-CM

## 2011-04-08 DIAGNOSIS — E785 Hyperlipidemia, unspecified: Secondary | ICD-10-CM

## 2011-04-08 DIAGNOSIS — H6690 Otitis media, unspecified, unspecified ear: Secondary | ICD-10-CM

## 2011-04-08 DIAGNOSIS — D649 Anemia, unspecified: Secondary | ICD-10-CM

## 2011-04-08 DIAGNOSIS — E039 Hypothyroidism, unspecified: Secondary | ICD-10-CM

## 2011-04-08 DIAGNOSIS — H6692 Otitis media, unspecified, left ear: Secondary | ICD-10-CM

## 2011-04-08 DIAGNOSIS — T887XXA Unspecified adverse effect of drug or medicament, initial encounter: Secondary | ICD-10-CM

## 2011-04-08 DIAGNOSIS — J020 Streptococcal pharyngitis: Secondary | ICD-10-CM

## 2011-04-08 LAB — CBC WITH DIFFERENTIAL/PLATELET
Basophils Relative: 0.7 % (ref 0.0–3.0)
Hemoglobin: 13.1 g/dL (ref 12.0–15.0)
Lymphocytes Relative: 14.8 % (ref 12.0–46.0)
MCHC: 33.5 g/dL (ref 30.0–36.0)
Monocytes Relative: 7.1 % (ref 3.0–12.0)
Neutro Abs: 4.7 10*3/uL (ref 1.4–7.7)
RBC: 4.22 Mil/uL (ref 3.87–5.11)

## 2011-04-08 LAB — BASIC METABOLIC PANEL
CO2: 25 mEq/L (ref 19–32)
Calcium: 8.8 mg/dL (ref 8.4–10.5)
GFR: 81.2 mL/min (ref >60.00)
Sodium: 141 mEq/L (ref 135–145)

## 2011-04-08 LAB — LDL CHOLESTEROL, DIRECT: Direct LDL: 107.5 mg/dL

## 2011-04-08 LAB — HEPATIC FUNCTION PANEL
ALT: 66 U/L — ABNORMAL HIGH (ref 0–35)
AST: 79 U/L — ABNORMAL HIGH (ref 0–37)
Albumin: 4.1 g/dL (ref 3.5–5.2)
Alkaline Phosphatase: 65 U/L (ref 39–117)
Total Protein: 6.8 g/dL (ref 6.0–8.3)

## 2011-04-08 LAB — POCT RAPID STREP A (OFFICE): Rapid Strep A Screen: NEGATIVE

## 2011-04-08 MED ORDER — POLYMYXIN B-TRIMETHOPRIM 10000-0.1 UNIT/ML-% OP SOLN: OPHTHALMIC | Status: DC

## 2011-04-08 NOTE — Assessment & Plan Note (Signed)
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

## 2011-04-08 NOTE — Patient Instructions (Signed)
Ct scan today and will notify results . This could be a relapsing sinusitis.    Call if get rash or getting worse in the meantime  Warm compresses in the meantime

## 2011-04-08 NOTE — Telephone Encounter (Signed)
Per Dr.Panosh- Warm Compress and do polytrim eye drops 1 drop in affected every 4 hours while awake. Call on Friday or sooner if fever develops. Pt aware and rx sent to pharmacy

## 2011-04-08 NOTE — Progress Notes (Signed)
  Subjective:    Patient ID: Cynthia Floyd, female    DOB: [birth year], 70 y.o.   MRN: [id number]  HPI Patient comes in today for an add-on acute patient. She is here for blood work. She took all of the Augmentin and from her last visit and was doing somewhat better but had decreased hearing and popping in her left ear. Saw Dr Pollyann Kennedy on Sept 17th  And at that time was doing better except for fluid in the ear. He just suggested doing a new first for eustachian tube opening. Did not see any other issues. Finished antibiotic last week . No side effects. However for the last 2 days he is having increasing symptoms and awoke last night with her left eye swollen and somewhat hurting. Her left ear is hurting again it is harder to hear out of her left ear.  There is a sore throat on the left and a minor dry cough. No shortness of breath or fever. She's tried some over-the-counter eye drops for dry eyes no change no new exposures.  Usual rashes chills or headache.  Review of Systems See history of present illness   no nausea vomiting tingling ear discharge neck gland swollen on the left and tender Past history family history social history reviewed in the electronic medical record.     Objective:   Physical Exam WDWN in nad with obvious left perioribital swelling and some conjunctival redness  .   slight on left eyelid looks mildly swollen on the top no discharge noted. No vesicles or other rash PERRL EOMS NL no photophobia .  Nares mild congestion ? If no face tenderenss Ears : right normal left eac nl tm nl pars tensa and hemorrhagic pars flaccida  No discharge Op mild erythema and no lesions Neck supple tender left ac area nosig adenopathy Chest:  Clear to A&P without wheezes rales or rhonchi CV:  S1-S2 no gallops or murmurs peripheral perfusion is normal        Assessment & Plan:   Left lid swelling with red eye in setting of  rx for left ear infection and dysfunction.  Had been almost  better and now poss relapse vs new illness .      Get sinus ct today to decide on plan.

## 2011-04-09 NOTE — Progress Notes (Signed)
Pt aware of results 

## 2011-04-15 ENCOUNTER — Encounter: Payer: Self-pay | Admitting: Internal Medicine

## 2011-04-15 ENCOUNTER — Ambulatory Visit (INDEPENDENT_AMBULATORY_CARE_PROVIDER_SITE_OTHER): Payer: Medicare Other | Admitting: Internal Medicine

## 2011-04-15 VITALS — BP 120/80 | HR 66 | Wt 143.0 lb

## 2011-04-15 DIAGNOSIS — R945 Abnormal results of liver function studies: Secondary | ICD-10-CM

## 2011-04-15 DIAGNOSIS — H0259 Other disorders affecting eyelid function: Secondary | ICD-10-CM

## 2011-04-15 DIAGNOSIS — H0289 Other specified disorders of eyelid: Secondary | ICD-10-CM

## 2011-04-15 DIAGNOSIS — R7989 Other specified abnormal findings of blood chemistry: Secondary | ICD-10-CM

## 2011-04-15 DIAGNOSIS — E782 Mixed hyperlipidemia: Secondary | ICD-10-CM

## 2011-04-15 DIAGNOSIS — M546 Pain in thoracic spine: Secondary | ICD-10-CM

## 2011-04-15 DIAGNOSIS — Z23 Encounter for immunization: Secondary | ICD-10-CM

## 2011-04-15 DIAGNOSIS — F411 Generalized anxiety disorder: Secondary | ICD-10-CM

## 2011-04-15 DIAGNOSIS — R7402 Elevation of levels of lactic acid dehydrogenase (LDH): Secondary | ICD-10-CM

## 2011-04-15 HISTORY — DX: Abnormal results of liver function studies: R94.5

## 2011-04-15 HISTORY — DX: Other specified abnormal findings of blood chemistry: R79.89

## 2011-04-15 NOTE — Patient Instructions (Signed)
Call if back pain  Is progressive .  Liver tests in one month then advice   Wellness check in 6 months .

## 2011-04-15 NOTE — Progress Notes (Signed)
Subjective:    Patient ID: Cynthia Floyd, female    DOB: [birth year], 70 y.o.   MRN: [id number]  HPI Comes in for follow up of  multiple medical issues:   Back low back and aggravated   By bending and lifting sucha s HW and gardening but not noctunal and noassociated sx     High back pain  Between shoulders and related to  acitivity  .   Itchy left low back pain. At times  No fever progression but is still there if active  Walking ; reg acitivities  do not aggravate this . Has concerns cause .  Father had  renal  Disease and mom with pancreatic  Cancer.   Brother/ kidney cancer.   RESP infection:No progressive   But still there. Resp and eye better . But still has some intermittent am pain left throat area  Better in day . No fever cough better no face pain or rash.   LIPDS on med so se noted   Can take asa ecotrim  Reg asa with gi issues  Disc about calcium supplements and data on cv events  Review of Systems Tingling  Left back at times no weakness fever.  Tingling off vit C   . Using prn.  NO current cp sob wheezing new rash .  Back to reg activities  Past history family history social history reviewed in the electronic medical record.     Objective:   Physical Exam WDWN in nad ;looks  well today. HEENT: Normocephalic ;atraumatic , Eyes;  PERRL, EOMs  Full, lids and conjunctiva clear,,Ears: no deformities, canals nl, TM landmarks normal, Nose: no deformity or discharge  Mouth : OP clear without lesion  Neck no adenopathy but points to left ac area of tenderness Chest:  Clear to A&P without wheezes rales or rhonchi CV:  S1-S2 no gallops or murmurs peripheral perfusion is normal Abdomen:  Sof,t normal bowel sounds without hepatosplenomegaly, no guarding rebound or masses no CVA tenderness Back no focal tenderness gait normal no obv atrophy no pain today   No rashes Labs reviewed with patient.  Lab Results  Component Value Date   WBC 6.7 04/08/2011   HGB 13.1 04/08/2011   HCT  39.2 04/08/2011   PLT 328.0 04/08/2011   CHOL 213* 04/08/2011   TRIG 109.0 04/08/2011   HDL 88.10 04/08/2011   LDLDIRECT 107.5 04/08/2011   ALT 66* 04/08/2011   AST 79* 04/08/2011   NA 141 04/08/2011   K 4.0 04/08/2011   CL 105 04/08/2011   CREATININE 0.8 04/08/2011   BUN 21 04/08/2011   CO2 25 04/08/2011   TSH 0.69 04/08/2011   HGBA1C 6.1* 04/22/2007         Assessment & Plan:  Terrence Dupont sinus and eyes ear infection is better.  Disc about residual sx  Not concerning at this time   Call if still issues in another month or prn .     Back pain.  ": intermittent  Still seems very mechanical and no alarm features  (nocturnal systemic sx etc. )  Abn lfts prob from illness and meds.    No evidence of issu on exam  Will repeat  And no change in meds t this point  LIPIDS very good.  Anxiety stable  Preventive Health Care Counseled about available data on calcium supp  And cv event assoi Better to get in food than pills but still may need to supp if appropriate  Vit d  ok.  No evidence of cause of  heart disease. Flu shot today Total visit > 50% spent counseling and coordinating care    30 minutes

## 2011-05-13 ENCOUNTER — Other Ambulatory Visit (INDEPENDENT_AMBULATORY_CARE_PROVIDER_SITE_OTHER): Payer: Medicare Other

## 2011-05-13 DIAGNOSIS — R945 Abnormal results of liver function studies: Secondary | ICD-10-CM

## 2011-05-13 LAB — HEPATIC FUNCTION PANEL
AST: 38 U/L — ABNORMAL HIGH (ref 0–37)
Bilirubin, Direct: 0 mg/dL (ref 0.0–0.3)
Total Bilirubin: 0.5 mg/dL (ref 0.3–1.2)

## 2011-05-15 ENCOUNTER — Other Ambulatory Visit: Payer: BC Managed Care – PPO

## 2011-05-18 ENCOUNTER — Telehealth: Payer: Self-pay | Admitting: *Deleted

## 2011-05-18 NOTE — Telephone Encounter (Signed)
Pt would like liver function results.

## 2011-05-18 NOTE — Telephone Encounter (Signed)
See result notes for labs  Almost normal  Can fu at next ov.

## 2011-05-19 NOTE — Progress Notes (Signed)
Pt aware of results 

## 2011-05-19 NOTE — Telephone Encounter (Signed)
Pt aware of results 

## 2011-06-05 ENCOUNTER — Telehealth: Payer: Self-pay | Admitting: *Deleted

## 2011-06-05 NOTE — Telephone Encounter (Signed)
Refill on clorazepate, pantoprazole, citalopram, fluticasone, and lovastatin.

## 2011-06-08 MED ORDER — LOVASTATIN 20 MG PO TABS: 20.0000 mg | ORAL_TABLET | Freq: Every day | ORAL | Status: DC

## 2011-06-08 MED ORDER — FLUTICASONE PROPIONATE 50 MCG/ACT NA SUSP: 2.0000 | Freq: Every day | NASAL | Status: DC

## 2011-06-08 MED ORDER — PANTOPRAZOLE SODIUM 40 MG PO TBEC: 40.0000 mg | DELAYED_RELEASE_TABLET | Freq: Every day | ORAL | Status: DC

## 2011-06-08 MED ORDER — CLORAZEPATE DIPOTASSIUM 7.5 MG PO TABS: ORAL_TABLET | ORAL | Status: DC

## 2011-10-27 ENCOUNTER — Ambulatory Visit (INDEPENDENT_AMBULATORY_CARE_PROVIDER_SITE_OTHER): Payer: Medicare Other | Admitting: Internal Medicine

## 2011-10-27 ENCOUNTER — Encounter: Payer: Self-pay | Admitting: Internal Medicine

## 2011-10-27 VITALS — BP 118/82 | HR 77 | Temp 97.7°F | Wt 141.0 lb

## 2011-10-27 DIAGNOSIS — J309 Allergic rhinitis, unspecified: Secondary | ICD-10-CM

## 2011-10-27 DIAGNOSIS — R945 Abnormal results of liver function studies: Secondary | ICD-10-CM

## 2011-10-27 DIAGNOSIS — Z0000 Encounter for general adult medical examination without abnormal findings: Secondary | ICD-10-CM

## 2011-10-27 DIAGNOSIS — F411 Generalized anxiety disorder: Secondary | ICD-10-CM

## 2011-10-27 DIAGNOSIS — R7989 Other specified abnormal findings of blood chemistry: Secondary | ICD-10-CM

## 2011-10-27 DIAGNOSIS — R7309 Other abnormal glucose: Secondary | ICD-10-CM

## 2011-10-27 DIAGNOSIS — G2581 Restless legs syndrome: Secondary | ICD-10-CM

## 2011-10-27 DIAGNOSIS — E782 Mixed hyperlipidemia: Secondary | ICD-10-CM

## 2011-10-27 DIAGNOSIS — K219 Gastro-esophageal reflux disease without esophagitis: Secondary | ICD-10-CM

## 2011-10-27 MED ORDER — ESCITALOPRAM OXALATE 10 MG PO TABS: 10.0000 mg | ORAL_TABLET | Freq: Every day | ORAL | Status: DC

## 2011-10-27 NOTE — Progress Notes (Signed)
Subjective:    Patient ID: Cynthia Floyd, female    DOB: [birth year], 71 y.o.   MRN: [id number]  HPI Patient comes in today for preventive visit and follow-up of medical issues. Update  history since  last visit: No major changes ; ,injury surgery or hospitalizations.   Back bothers her when works in yard.    Tranxene  Seldom uses at night   1/4 or so.  Ave 10 less or more.  LIPIDS meds no se   Allergy uses flonase as needed  Citalopram.  30 mg per day up and down sometimes.    depending on anxiety state    Hearing:  OK slight dec tinnitus at times  Vision:  No limitations at present . Glasses   Safety:  Has smoke detector and wears seat belts.  No firearms. No excess sun exposure. Sees dentist regularly.  Falls:  No   Advance directive :  Reviewed .  Memory: Felt to be good  , no concern from her or her family.  Depression: No anhedonia unusual crying or depressive symptoms  Nutrition: Eats well balanced diet; adequate calcium and vitamin D. No swallowing chewiing problems.  Injury: no major injuries in the last six months.  Other healthcare providers:  Reviewed today .  Social:  Lives with husband married. No pets.   Preventive parameters: up-to-date on colonoscopy, mammogram, immunizations. Including Tdap and pneumovax.  ADLS:   There are no problems or need for assistance  driving, feeding, obtaining food, dressing, toileting and bathing, managing money using phone. She is independent.  etoh 1-2 per day max does reg exercise  Treadmill yard work    Review of Systems ROS:  GEN/ HEENT: No fever, significant weight changes sweats headaches vision problems hearing changes, CV/ PULM; No new chest pain   Gets chest tightness with anxiety as always  No shortness of breath cough, syncope,edema  change in exercise tolerance. GI /GU: No adominal pain, vomiting, change in bowel habits. No blood in the stool. No significant GU symptoms. SKIN/HEME: ,no acute skin  rashes suspicious lesions or bleeding. No lymphadenopathy, nodules, masses.  NEURO/ PSYCH:  No neurologic signs such as weakness numbness. No depression anxiety. IMM/ Allergy: No unusual infections.  Allergy .   REST of 12 system review negative except as per HPI   Past history family history social history reviewed in the electronic medical record.  Outpatient Prescriptions Prior to Visit  Medication Sig Dispense Refill  . fluticasone (FLONASE) 50 MCG/ACT nasal spray Place 2 sprays into the nose daily.  48 g  3  . lovastatin (MEVACOR) 20 MG tablet Take 1 tablet (20 mg total) by mouth daily.  90 tablet  3  . pantoprazole (PROTONIX) 40 MG tablet Take 1 tablet (40 mg total) by mouth daily.  90 tablet  3  . citalopram (CELEXA) 10 MG tablet Take 1 tablet (10 mg total) by mouth daily. Takes 3 tabs daily  90 tablet  12  . clorazepate (TRANXENE) 7.5 MG tablet 1 to 2 tabs by mouth daily as needed  90 tablet  0  .     0        Objective:   Physical Exam Physical Exam: BP 118/82  Pulse 77  Temp(Src) 97.7 F (36.5 C) (Oral)  Wt 141 lb (63.957 kg)  SpO2 98%  Vital signs reviewed WUJ:WJXB is a well-developed well-nourished alert cooperative  white female who appears her stated age in no acute distress.  HEENT: normocephalic atraumatic ,  Eyes: PERRL EOM's full, conjunctiva clear, Nares: paten,t no deformity discharge or tenderness., Ears: no deformity EAC's clear TMs with normal landmarks. Mouth: clear OP, no lesions, edema.  Moist mucous membranes. Dentition in adequate repair. NECK: supple without masses, thyromegaly or bruits. CHEST/PULM:  Clear to auscultation and percussion breath sounds equal no wheeze , rales or rhonchi. No chest wall deformities or tenderness. CV: PMI is nondisplaced, S1 S2 no gallops, murmurs, rubs. Peripheral pulses are full without delay.No JVD .  ABDOMEN: Bowel sounds normal nontender  No guard or rebound, no hepato splenomegal no CVA tenderness.  No  hernia. Extremtities:  No clubbing cyanosis or edema, no acute joint swelling or redness no focal atrophy NEURO:  Oriented x3, cranial nerves 3-12 appear to be intact, no obvious focal weakness,gait within normal limits no abnormal reflexes or asymmetrical SKIN: No acute rashes normal turgor, color, no bruising or petechiae. PSYCH: Oriented, good eye contact, no obvious depression anxiety, cognition and judgment appear normal. LN: no cervical axillary inguinal adenopathy Lab Results  Component Value Date   WBC 6.7 04/08/2011   HGB 13.1 04/08/2011   HCT 39.2 04/08/2011   PLT 328.0 04/08/2011   GLUCOSE 88 04/08/2011   CHOL 213* 04/08/2011   TRIG 109.0 04/08/2011   HDL 88.10 04/08/2011   LDLDIRECT 107.5 04/08/2011   LDLCALC 88 11/16/2006   ALT 34 05/13/2011   AST 38* 05/13/2011   NA 141 04/08/2011   K 4.0 04/08/2011   CL 105 04/08/2011   CREATININE 0.8 04/08/2011   BUN 21 04/08/2011   CO2 25 04/08/2011   TSH 0.69 04/08/2011   HGBA1C 6.1* 04/22/2007        Assessment & Plan:  Preventive Health Care Counseled regarding healthy nutrition, exercise, sleep, injury prevention, calcium vit d and healthy weight . CT updated  Last eye check [facility] 2012 LFTS:  Monitoring  Last US showed fatty liver  LIPIDS: no change  Allergy: continue Anxiety : disc not  Using prn dosing can cause se  And if need to increas we should change back to lexapro now that this is generic as this worked well  In the past  Uses rescue med infrequently  RLS GI;: stable  Hxof hyperglycemia  Stable

## 2011-10-27 NOTE — Patient Instructions (Addendum)
ty changing back to lexapro generic .     Better to take these meds  Same dose every day to prevent   ROV  In 1-2 months or as needed. Labs due in 6 months. Check with Dr Delia Chimes office about getting  Repeat colonscopy

## 2011-10-30 ENCOUNTER — Other Ambulatory Visit: Payer: Self-pay

## 2011-10-30 MED ORDER — CLORAZEPATE DIPOTASSIUM 7.5 MG PO TABS: ORAL_TABLET | ORAL | Status: DC

## 2011-10-30 NOTE — Telephone Encounter (Signed)
Ok per Dr. Fabian Sharp to fill #[phone] x 1.

## 2011-11-01 DIAGNOSIS — J309 Allergic rhinitis, unspecified: Secondary | ICD-10-CM | POA: Insufficient documentation

## 2011-11-01 DIAGNOSIS — Z0000 Encounter for general adult medical examination without abnormal findings: Secondary | ICD-10-CM | POA: Insufficient documentation

## 2011-11-01 NOTE — Assessment & Plan Note (Signed)
Monitor periodically.

## 2011-11-07 ENCOUNTER — Other Ambulatory Visit: Payer: Self-pay | Admitting: Internal Medicine

## 2011-11-27 ENCOUNTER — Ambulatory Visit (INDEPENDENT_AMBULATORY_CARE_PROVIDER_SITE_OTHER): Payer: Medicare Other | Admitting: Internal Medicine

## 2011-11-27 ENCOUNTER — Encounter: Payer: Self-pay | Admitting: Internal Medicine

## 2011-11-27 VITALS — BP 112/80 | HR 85 | Temp 98.4°F | Wt 142.0 lb

## 2011-11-27 DIAGNOSIS — E782 Mixed hyperlipidemia: Secondary | ICD-10-CM

## 2011-11-27 DIAGNOSIS — F411 Generalized anxiety disorder: Secondary | ICD-10-CM

## 2011-11-27 MED ORDER — LOVASTATIN 20 MG PO TABS: 20.0000 mg | ORAL_TABLET | Freq: Every day | ORAL | Status: DC

## 2011-11-27 MED ORDER — FLUTICASONE PROPIONATE 50 MCG/ACT NA SUSP: 2.0000 | Freq: Every day | NASAL | Status: DC

## 2011-11-27 MED ORDER — ESCITALOPRAM OXALATE 20 MG PO TABS: 30.0000 mg | ORAL_TABLET | Freq: Every day | ORAL | Status: DC

## 2011-11-27 NOTE — Progress Notes (Signed)
  Subjective:    Patient ID: Cynthia Floyd, female    DOB: 07-30-1940, 71 y.o.   MRN: [id number]  HPI Fu of meds.   Patient comes in today for followup of a change in medication from citalopram to generic Lexapro. She's been taking the equivalent of 20 mg of Lexapro for a few weeks at least. Her for she is much distress because she was unable to get an answer from our office about the refill for her medicine and states that she had called and never had anyone call her back. About the prescription.   Review of Systems Negative for her shortness of breath syncope new symptoms although does get chest tightness with her anxiety. Needs refills on her lovastatin and Flonase this works well for her allergies Outpatient Prescriptions Prior to Visit  Medication Sig Dispense Refill  . clorazepate (TRANXENE) 7.5 MG tablet 1 to 2 tabs by mouth daily as needed  90 tablet  1  . fluticasone (FLONASE) 50 MCG/ACT nasal spray USE 2 SPRAYS EVERY DAY AS DIRECTED  16 g  0  . pantoprazole (PROTONIX) 40 MG tablet Take 1 tablet (40 mg total) by mouth daily.  90 tablet  3  . escitalopram (LEXAPRO) 10 MG tablet Take 1 tablet (10 mg total) by mouth daily. May increase to 20 mg per day  40 tablet  3  . fluticasone (FLONASE) 50 MCG/ACT nasal spray Place 2 sprays into the nose daily.  48 g  3  . lovastatin (MEVACOR) 20 MG tablet Take 1 tablet (20 mg total) by mouth daily.  90 tablet  3       Objective:   Physical Exam BP 112/80  Pulse 85  Temp(Src) 98.4 F (36.9 C) (Oral)  Wt 142 lb (64.411 kg)  SpO2 98% Well-developed well-nourished in no acute distress  Oriented x3 normal speech and thought no tremors.     Assessment & Plan:  Anxiety Change meds   medicaition issues . Problems with getting the refills through our office and the pharmacy which is added to frustration at this time.  We'll write a prescription to take 30 mg a day 45 pills of the 20 mg tablets with refills but at this time would rather her  stay in the 20 mg for a few more weeks before she tries elevating the dose.  Would like her to call in 1-2 months about how she is doing but she has reservations of whether she can get through our telephone system.  Discussed options about navigating the phone system.  Otherwise would have her come back in 6 months for visits. We'll refill the Flonase and the lovastatin locally.  Total visit > 50% spent counseling and coordinating care

## 2011-11-27 NOTE — Patient Instructions (Signed)
Stay on lexapro 20 mg per day and increase to 30 mg  Per day  As we discussed. Let us know how you are doing.

## 2011-12-07 ENCOUNTER — Encounter: Payer: Self-pay | Admitting: Internal Medicine

## 2012-01-14 ENCOUNTER — Encounter: Payer: Self-pay | Admitting: Internal Medicine

## 2012-01-14 ENCOUNTER — Ambulatory Visit (INDEPENDENT_AMBULATORY_CARE_PROVIDER_SITE_OTHER): Payer: Medicare Other | Admitting: Internal Medicine

## 2012-01-14 VITALS — BP 130/80 | HR 86 | Temp 97.8°F | Wt 144.0 lb

## 2012-01-14 DIAGNOSIS — Z79899 Other long term (current) drug therapy: Secondary | ICD-10-CM | POA: Insufficient documentation

## 2012-01-14 DIAGNOSIS — F411 Generalized anxiety disorder: Secondary | ICD-10-CM

## 2012-01-14 MED ORDER — CLORAZEPATE DIPOTASSIUM 7.5 MG PO TABS: ORAL_TABLET | ORAL | Status: DC

## 2012-01-14 NOTE — Progress Notes (Signed)
  Subjective:    Patient ID: Cynthia Floyd, female    DOB: 03-Jul-1941, 71 y.o.   MRN: [id number]  HPI Patient comes in today for follow up of  anxiety meds. When taking Lexapro 30 mg a day it did help her anxiety and chest pain however she felt tired and draggy and  apathetic. So she went back down to 20 mg. He notices in the afternoon her in today she sometimes gets a chest tightness that she identifies as anxiety from the past. If she takes a quarter of a chlorazepate she feels a lot better. There is no associated nausea ,vomiting, shortness of breath sweating. Is no change in exercise tolerance.  No other sx or problems new. Review of Systems No fever weight loss GI GU changes bleeding . Headaches.   Outpatient Encounter Prescriptions as of 01/14/2012  Medication Sig Dispense Refill  . clorazepate (TRANXENE) 7.5 MG tablet 1 to 2 tabs by mouth daily as needed  90 tablet  1  . escitalopram (LEXAPRO) 20 MG tablet Take 1.5 tablets (30 mg total) by mouth daily.  45 tablet  3  . fluticasone (FLONASE) 50 MCG/ACT nasal spray Place 2 sprays into the nose daily.  48 g  6  . lovastatin (MEVACOR) 20 MG tablet Take 1 tablet (20 mg total) by mouth daily.  30 tablet  6  . pantoprazole (PROTONIX) 40 MG tablet Take 1 tablet (40 mg total) by mouth daily.  90 tablet  3  . DISCONTD: fluticasone (FLONASE) 50 MCG/ACT nasal spray USE 2 SPRAYS EVERY DAY AS DIRECTED  16 g  0  Past history family history social history reviewed in the electronic medical record.  prob had add other   Perfectionist behavior in the past.      Objective:   Physical Exam BP 130/80  Pulse 86  Temp 97.8 F (36.6 C) (Oral)  Wt 144 lb (65.318 kg)  SpO2 98%  WDWN in nad  Oriented x 3. Normal cognition, attention, speech.although talks qhickly Not  depressed appearing   Good eye contact . Neck: Supple without adenopathy or masses or bruits Chest:  Clear to A&P without wheezes rales or rhonchi CV:  S1-S2 no gallops or murmurs  peripheral perfusion is normal Abdomen:  Sof,t normal bowel sounds without hepatosplenomegaly, no guarding rebound or masses no CVA tenderness. No clubbing cyanosis or edema     Assessment & Plan:  Anxiety Agree with staying at 20 mg or lexapro.  Can add  Tranxene small amt as needed  Early  .   Strategies discussed. No evidence of cv pulm disease of significance. Chest tightness without assoc sx and response to tranxene is prob anxiety. Refill given   Recheck when due( early 2014 or  As needed.)

## 2012-01-14 NOTE — Patient Instructions (Addendum)
I agree that the symptoms are from anxiety.  Stay on the 20 mg of lexapro. And can use 1/4 - 1/2 the chlorazepate as needed and track these symptoms.  The anxiety sx may subside over  that time.

## 2012-02-10 ENCOUNTER — Other Ambulatory Visit: Payer: Self-pay | Admitting: Internal Medicine

## 2012-02-10 MED ORDER — CITALOPRAM HYDROBROMIDE 10 MG PO TABS: ORAL_TABLET | ORAL | Status: DC

## 2012-03-24 ENCOUNTER — Other Ambulatory Visit: Payer: Self-pay | Admitting: Dermatology

## 2012-05-23 ENCOUNTER — Other Ambulatory Visit: Payer: Self-pay | Admitting: Internal Medicine

## 2012-06-01 ENCOUNTER — Other Ambulatory Visit: Payer: Self-pay | Admitting: Internal Medicine

## 2012-06-03 ENCOUNTER — Other Ambulatory Visit: Payer: Self-pay | Admitting: Family Medicine

## 2012-06-03 MED ORDER — CLORAZEPATE DIPOTASSIUM 7.5 MG PO TABS: ORAL_TABLET | ORAL | Status: DC

## 2012-08-04 ENCOUNTER — Encounter: Payer: Self-pay | Admitting: Internal Medicine

## 2012-08-23 ENCOUNTER — Ambulatory Visit (AMBULATORY_SURGERY_CENTER): Payer: Medicare Other | Admitting: *Deleted

## 2012-08-23 VITALS — Ht 66.0 in | Wt 144.6 lb

## 2012-08-23 DIAGNOSIS — Z1211 Encounter for screening for malignant neoplasm of colon: Secondary | ICD-10-CM

## 2012-08-23 MED ORDER — MOVIPREP 100 G PO SOLR: ORAL | Status: DC

## 2012-09-06 ENCOUNTER — Encounter: Payer: Self-pay | Admitting: Internal Medicine

## 2012-09-06 ENCOUNTER — Ambulatory Visit (AMBULATORY_SURGERY_CENTER): Payer: Medicare Other | Admitting: Internal Medicine

## 2012-09-06 VITALS — BP 137/61 | HR 58 | Temp 97.9°F | Resp 16 | Ht 66.0 in | Wt 144.0 lb

## 2012-09-06 DIAGNOSIS — Z1211 Encounter for screening for malignant neoplasm of colon: Secondary | ICD-10-CM

## 2012-09-06 MED ORDER — SODIUM CHLORIDE 0.9 % IV SOLN: 500.0000 mL | INTRAVENOUS | Status: DC

## 2012-09-06 NOTE — Op Note (Signed)
[facility] [address] [place], [address]   COLONOSCOPY PROCEDURE REPORT  PATIENT: Cynthia Floyd, Cynthia Floyd  MR#: [id number] BIRTHDATE: [birth year] , 71  yrs. old GENDER: Female ENDOSCOPIST: Hart Carwin, MD REFERRED BY:  Madelin Headings, M.D. PROCEDURE DATE:  09/06/2012 PROCEDURE:   Colonoscopy, screening ASA CLASS:   Class II INDICATIONS:Average risk patient for colon cancer and normal colonoscopy in 2003. MEDICATIONS: MAC sedation, administered by CRNA and propofol (Diprivan) 300mg  IV  DESCRIPTION OF PROCEDURE:   After the risks and benefits and of the procedure were explained, informed consent was obtained.  A digital rectal exam revealed no abnormalities of the rectum.    The LB [id number]  endoscope was introduced through the anus and advanced to the cecum, which was identified by both the appendix and ileocecal valve .  The quality of the prep was good, using MoviPrep .  The instrument was then slowly withdrawn as the colon was fully examined.     COLON FINDINGS: Mild diverticulosis was noted in the sigmoid colon. Retroflexed views revealed no abnormalities.     The scope was then withdrawn from the patient and the procedure completed.  COMPLICATIONS: There were no complications. ENDOSCOPIC IMPRESSION: Mild diverticulosis was noted in the sigmoid colon  RECOMMENDATIONS: High fiber diet   REPEAT EXAM:no recall due to age fo  cc:  _______________________________ eSigned:  Hart Carwin, MD 09/06/2012 10:06 AM

## 2012-09-06 NOTE — Progress Notes (Signed)
Patient did not experience any of the following events: a burn prior to discharge; a fall within the facility; wrong site/side/patient/procedure/implant event; or a hospital transfer or hospital admission upon discharge from the facility. (G8907) Patient did not have preoperative order for IV antibiotic SSI prophylaxis. (G8918)  

## 2012-09-06 NOTE — Patient Instructions (Signed)
YOU HAD AN ENDOSCOPIC PROCEDURE TODAY AT THE [facility]: Refer to the procedure report that was given to you for any specific questions about what was found during the examination.  If the procedure report does not answer your questions, please call your gastroenterologist to clarify.  If you requested that your care partner not be given the details of your procedure findings, then the procedure report has been included in a sealed envelope for you to review at your convenience later.  YOU SHOULD EXPECT: Some feelings of bloating in the abdomen. Passage of more gas than usual.  Walking can help get rid of the air that was put into your GI tract during the procedure and reduce the bloating. If you had a lower endoscopy (such as a colonoscopy or flexible sigmoidoscopy) you may notice spotting of blood in your stool or on the toilet paper. If you underwent a bowel prep for your procedure, then you may not have a normal bowel movement for a few days.  DIET: Your first meal following the procedure should be a light meal and then it is ok to progress to your normal diet.  A half-sandwich or bowl of soup is an example of a good first meal.  Heavy or fried foods are harder to digest and may make you feel nauseous or bloated.  Likewise meals heavy in dairy and vegetables can cause extra gas to form and this can also increase the bloating.  Drink plenty of fluids but you should avoid alcoholic beverages for 24 hours.  ACTIVITY: Your care partner should take you home directly after the procedure.  You should plan to take it easy, moving slowly for the rest of the day.  You can resume normal activity the day after the procedure however you should NOT DRIVE or use heavy machinery for 24 hours (because of the sedation medicines used during the test).    SYMPTOMS TO REPORT IMMEDIATELY: A gastroenterologist can be reached at any hour.  During normal business hours, 8:30 AM to 5:00 PM Monday through Friday,  call [phone].  After hours and on weekends, please call the GI answering service at [phone] who will take a message and have the physician on call contact you.   Following lower endoscopy (colonoscopy or flexible sigmoidoscopy):  Excessive amounts of blood in the stool  Significant tenderness or worsening of abdominal pains  Swelling of the abdomen that is new, acute  Fever of 100F or higher    FOLLOW UP: If any biopsies were taken you will be contacted by phone or by letter within the next 1-3 weeks.  Call your gastroenterologist if you have not heard about the biopsies in 3 weeks.  Our staff will call the home number listed on your records the next business day following your procedure to check on you and address any questions or concerns that you may have at that time regarding the information given to you following your procedure. This is a courtesy call and so if there is no answer at the home number and we have not heard from you through the emergency physician on call, we will assume that you have returned to your regular daily activities without incident.  SIGNATURES/CONFIDENTIALITY: You and/or your care partner have signed paperwork which will be entered into your electronic medical record.  These signatures attest to the fact that that the information above on your After Visit Summary has been reviewed and is understood.  Full responsibility of the confidentiality   of this discharge information lies with you and/or your care-partner.     

## 2012-09-06 NOTE — Progress Notes (Signed)
Glasses given to spouse at 9:08.

## 2012-09-07 ENCOUNTER — Telehealth: Payer: Self-pay | Admitting: *Deleted

## 2012-09-07 NOTE — Telephone Encounter (Signed)
  Follow up Call-  Call back number 09/06/2012  Post procedure Call Back phone  # [phone]  Permission to leave phone message Yes     Patient questions:  Do you have a fever, pain , or abdominal swelling? no Pain Score  0 *  Have you tolerated food without any problems? yes  Have you been able to return to your normal activities? yes  Do you have any questions about your discharge instructions: Diet   no Medications  no Follow up visit  no  Do you have questions or concerns about your Care? no  Actions: * If pain score is 4 or above: No action needed, pain <4.

## 2012-09-12 ENCOUNTER — Other Ambulatory Visit: Payer: Self-pay | Admitting: Family Medicine

## 2012-09-12 MED ORDER — PANTOPRAZOLE SODIUM 40 MG PO TBEC: 40.0000 mg | DELAYED_RELEASE_TABLET | Freq: Every day | ORAL | Status: DC

## 2012-10-23 ENCOUNTER — Other Ambulatory Visit: Payer: Self-pay | Admitting: Internal Medicine

## 2012-10-25 ENCOUNTER — Telehealth: Payer: Self-pay | Admitting: Family Medicine

## 2012-10-25 NOTE — Telephone Encounter (Signed)
Error

## 2012-10-27 ENCOUNTER — Other Ambulatory Visit: Payer: Self-pay | Admitting: Family Medicine

## 2012-10-27 MED ORDER — CLORAZEPATE DIPOTASSIUM 7.5 MG PO TABS: ORAL_TABLET | ORAL | Status: DC

## 2012-10-28 ENCOUNTER — Other Ambulatory Visit: Payer: Self-pay | Admitting: Internal Medicine

## 2012-11-14 ENCOUNTER — Encounter: Payer: Self-pay | Admitting: Internal Medicine

## 2012-11-14 ENCOUNTER — Ambulatory Visit (INDEPENDENT_AMBULATORY_CARE_PROVIDER_SITE_OTHER): Payer: Medicare Other | Admitting: Internal Medicine

## 2012-11-14 VITALS — BP 148/90 | HR 64 | Temp 98.5°F | Ht 65.25 in | Wt 146.0 lb

## 2012-11-14 DIAGNOSIS — E782 Mixed hyperlipidemia: Secondary | ICD-10-CM

## 2012-11-14 DIAGNOSIS — Z0110 Encounter for examination of ears and hearing without abnormal findings: Secondary | ICD-10-CM

## 2012-11-14 DIAGNOSIS — R030 Elevated blood-pressure reading, without diagnosis of hypertension: Secondary | ICD-10-CM

## 2012-11-14 DIAGNOSIS — R7309 Other abnormal glucose: Secondary | ICD-10-CM

## 2012-11-14 DIAGNOSIS — Z0000 Encounter for general adult medical examination without abnormal findings: Secondary | ICD-10-CM

## 2012-11-14 DIAGNOSIS — F411 Generalized anxiety disorder: Secondary | ICD-10-CM

## 2012-11-14 DIAGNOSIS — Z79899 Other long term (current) drug therapy: Secondary | ICD-10-CM

## 2012-11-14 LAB — LIPID PANEL
HDL: 78.4 mg/dL (ref >39.00)
VLDL: 19 mg/dL (ref 0.0–40.0)

## 2012-11-14 LAB — CBC WITH DIFFERENTIAL/PLATELET
Basophils Absolute: 0 10*3/uL (ref 0.0–0.1)
Hemoglobin: 13 g/dL (ref 12.0–15.0)
Lymphocytes Relative: 21.5 % (ref 12.0–46.0)
Monocytes Relative: 9.3 % (ref 3.0–12.0)
Neutro Abs: 3.6 10*3/uL (ref 1.4–7.7)
RBC: 4.2 Mil/uL (ref 3.87–5.11)
RDW: 13.8 % (ref 11.5–14.6)

## 2012-11-14 LAB — BASIC METABOLIC PANEL
Calcium: 9.3 mg/dL (ref 8.4–10.5)
GFR: 75.02 mL/min (ref >60.00)
Glucose, Bld: 99 mg/dL (ref 70–99)
Sodium: 142 mEq/L (ref 135–145)

## 2012-11-14 LAB — HEPATIC FUNCTION PANEL
AST: 32 U/L (ref 0–37)
Albumin: 4.2 g/dL (ref 3.5–5.2)
Alkaline Phosphatase: 36 U/L — ABNORMAL LOW (ref 39–117)
Total Bilirubin: 0.6 mg/dL (ref 0.3–1.2)

## 2012-11-14 LAB — HEMOGLOBIN A1C: Hgb A1c MFr Bld: 6 % (ref 4.6–6.5)

## 2012-11-14 LAB — LDL CHOLESTEROL, DIRECT: Direct LDL: 146.6 mg/dL

## 2012-11-14 MED ORDER — CLORAZEPATE DIPOTASSIUM 7.5 MG PO TABS: ORAL_TABLET | ORAL | Status: DC

## 2012-11-14 NOTE — Progress Notes (Signed)
Chief Complaint  Patient presents with  . Medicare Wellness    HPI: Patient comes in today for Preventive Medicare wellness visit . No major injuries, ed visits ,hospitalizations , new medications since last visit.  Still with anxiety ocass panic time .    Taking celexa and prn chlorazepate  No change    Hearing:  Some dec in left ear . ? Had ear infection.   Vision:  No limitations at present . Last eye check UTD  Safety:  Has smoke detector and wears seat belts.  No firearms. No excess sun exposure. Sees dentist regularly.  Falls:  No   Advance directive :  Reviewed    Memory: Felt to be good  , no concern from her or her family.  Depression: No anhedonia unusual crying or depressive symptoms  Nutrition: Eats well balanced diet; adequate calcium and vitamin D. No swallowing chewing problems.  Injury: no major injuries in the last six months.  Other healthcare providers:  Reviewed today .  Social:  Lives with spouse married. No pets.  Cat died 2 year ago   Preventive parameters: up-to-date  Reviewed   ADLS:   There are no problems or need for assistance  driving, feeding, obtaining food, dressing, toileting and bathing, managing money using phone. She is independent.  EXERCISE/ HABITS  Per week   Active  Walking  No tobacco    Etoh..  upt to 3  Per day.    ROS:  GEN/ HEENT: No fever, significant weight changes sweats headaches vision problems hearing changes, except as above  CV/ PULM; No chest pain shortness of breath cough, syncope,edema  change in exercise tolerance. GI /GU: No adominal pain, vomiting, change in bowel habits. No blood in the stool. No significant GU symptoms. SKIN/HEME: ,no acute skin rashes suspicious lesions or bleeding. No lymphadenopathy, nodules, masses.  NEURO/ PSYCH:  No neurologic signs such as weakness numbness. No depression anxiety. IMM/ Allergy: No unusual infections.  Allergy .   REST of 12 system review negative except as per  HPI   Past Medical History  Diagnosis Date  . Generalized headaches     MRI head 2008 negative  008  . RLS (restless legs syndrome)   . Ovarian cyst     remote hx  . Anxiety   . GERD (gastroesophageal reflux disease)   . Hyperlipidemia     Family History  Problem Relation Age of Onset  . Pancreatic cancer Mother   . Diabetes Father   . Heart disease Father   . Hypertension Father   . Kidney disease Father   . Melanoma    . Kidney cancer Brother   . Colon cancer Neg Hx   . Stomach cancer Neg Hx     History   Social History  . Marital Status: Married    Spouse Name: N/A    Number of Children: N/A  . Years of Education: N/A   Social History Main Topics  . Smoking status: Never Smoker   . Smokeless tobacco: Never Used  . Alcohol Use: 8.4 oz/week    14 Glasses of wine per week  . Drug Use: No  . Sexually Active: None   Other Topics Concern  . None   Social History Narrative   Retired   Married  h h of 2    Regular exercise-no   Cats   CB x 2  Outpatient Encounter Prescriptions as of 11/14/2012  Medication Sig Dispense Refill  . citalopram (CELEXA) 10 MG tablet TAKE 3 TABLETS EVERY DAY  90 tablet  10  . clorazepate (TRANXENE) 7.5 MG tablet TAKE 1-2 TABLETS EVERY DAY AS NEEDED  60 tablet  1  . fluticasone (FLONASE) 50 MCG/ACT nasal spray Place 2 sprays into the nose daily.  48 g  6  . lovastatin (MEVACOR) 20 MG tablet Take 1 tablet (20 mg total) by mouth daily.  30 tablet  6  . MULTIPLE VITAMIN PO Take by mouth.      . pantoprazole (PROTONIX) 40 MG tablet Take 1 tablet (40 mg total) by mouth daily.  90 tablet  0  . [DISCONTINUED] clorazepate (TRANXENE) 7.5 MG tablet TAKE 1-2 TABLETS EVERY DAY AS NEEDED  90 tablet  0  . MOVIPREP 100 G SOLR        No facility-administered encounter medications on file as of 11/14/2012.    EXAM:  BP 148/90  Pulse 64  Temp(Src) 98.5 F (36.9 C) (Oral)  Ht 5' 5.25" (1.657 m)  Wt 146 lb (66.225 kg)  BMI  24.12 kg/m2  SpO2 97%  Body mass index is 24.12 kg/(m^2).  Physical Exam: Vital signs reviewed HYQ:MVHQ is a well-developed well-nourished alert cooperative   who appears stated age in no acute distress.  HEENT: normocephalic atraumatic , Eyes: PERRL EOM's full, conjunctiva clear, Nares: paten,t no deformity discharge or tenderness., Ears: no deformity EAC's clear TMs wax in eacs  Flushed  And hearing better  Left and right  Mouth: clear OP, no lesions, edema.  Moist mucous membranes. Dentition in adequate repair. NECK: supple without masses, thyromegaly or bruits. CHEST/PULM:  Clear to auscultation and percussion breath sounds equal no wheeze , rales or rhonchi. No chest wall deformities or tenderness. Breast: normal by inspection . No dimpling, discharge, masses, tenderness or discharge . CV: PMI is nondisplaced, S1 S2 no gallops, murmurs, rubs. Peripheral pulses are full without delay.No JVD .  ABDOMEN: Bowel sounds normal nontender  No guard or rebound, no hepato splenomegal no CVA tenderness.  No hernia. Extremtities:  No clubbing cyanosis or edema, no acute joint swelling or redness no focal atrophy NEURO:  Oriented x3, cranial nerves 3-12 appear to be intact, no obvious focal weakness,gait within normal limits no abnormal reflexes or asymmetrical SKIN: No acute rashes normal turgor, color, no bruising or petechiae. PSYCH: Oriented, good eye contact, no obvious depression anxiety, cognition and judgment appear normal. LN: no cervical axillary inguinal adenopathy No noted deficits in memory, attention, and speech.   Lab Results  Component Value Date   WBC 5.8 11/14/2012   HGB 13.0 11/14/2012   HCT 38.5 11/14/2012   PLT 254.0 11/14/2012   GLUCOSE 99 11/14/2012   CHOL 220* 11/14/2012   TRIG 95.0 11/14/2012   HDL 78.40 11/14/2012   LDLDIRECT 146.6 11/14/2012   LDLCALC 88 11/16/2006   ALT 30 11/14/2012   AST 32 11/14/2012   NA 142 11/14/2012   K 5.2* 11/14/2012   CL 107 11/14/2012    CREATININE 0.8 11/14/2012   BUN 20 11/14/2012   CO2 28 11/14/2012   TSH 0.91 11/14/2012   HGBA1C 6.0 11/14/2012    ASSESSMENT AND PLAN:  Discussed the following assessment and plan:  Medicare annual wellness visit, subsequent - Plan: MOVIPREP 100 G SOLR, MULTIPLE VITAMIN PO, Basic metabolic panel, CBC with Differential, Hepatic function panel, TSH, Lipid panel, Hemoglobin A1c  HYPERLIPIDEMIA - Plan: MOVIPREP 100 G SOLR, MULTIPLE  VITAMIN PO, Basic metabolic panel, CBC with Differential, Hepatic function panel, TSH, Lipid panel, Hemoglobin A1c  ANXIETY STATE NOS - some sx counseled  no change in managment wih meds at this time fu of changes  - Plan: MOVIPREP 100 G SOLR, MULTIPLE VITAMIN PO, Basic metabolic panel, CBC with Differential, Hepatic function panel, TSH, Lipid panel, Hemoglobin A1c  HYPERGLYCEMIA - Plan: MOVIPREP 100 G SOLR, MULTIPLE VITAMIN PO, Basic metabolic panel, CBC with Differential, Hepatic function panel, TSH, Lipid panel, Hemoglobin A1c  Medication management - Plan: MOVIPREP 100 G SOLR, MULTIPLE VITAMIN PO, Basic metabolic panel, CBC with Differential, Hepatic function panel, TSH, Lipid panel, Hemoglobin A1c  Elevated blood pressure reading - 148/90 repeat   recheck and rx if elevated in the over 155+ range   Risk benefit  discussed Patient Care Team: Madelin Headings, MD as PCP - General Serena Colonel, MD Noralee Stain, MD as Attending Physician (Dermatology) Vincenza Hews, MD (Ophthalmology) Serena Colonel, MD as Attending Physician (Otolaryngology)  Patient Instructions  Continue lifestyle intervention healthy eating and exercise .  I think your sx are from anxiety .       consider adding activities such as yoga    Suggest get audiology to see you about your left ear if   persistent or progressive     How to Take Your Blood Pressure  These instructions are only for electronic home blood pressure machines. You will need:   An automatic or semi-automatic blood  pressure machine.  Fresh batteries for the blood pressure machine. HOW DO I USE THESE TOOLS TO CHECK MY BLOOD PRESSURE?   There are 2 numbers that make up your blood pressure. For example: 120/80.  The first number (120 in our example) is called the "systolic pressure." It is a measure of the pressure in your blood vessels when your heart is pumping blood.  The second number (80 in our example) is called the "diastolic pressure." It is a measure of the pressure in your blood vessels when your heart is resting between beats.  Before you buy a home blood pressure machine, check the size of your arm so you can buy the right size cuff. Here is how to check the size of your arm:  Use a tape measure that shows both inches and centimeters.  Wrap the tape measure around the middle upper part of your arm. You may need someone to help you measure right.  Write down your arm measurement in both inches and centimeters.  To measure your blood pressure right, it is important to have the right size cuff.  If your arm is up to 13 inches (37 to 34 centimeters), get an adult cuff size.  If your arm is 13 to 17 inches (35 to 44 centimeters), get a large adult cuff size.  If your arm is 17 to 20 inches (45 to 52 centimeters), get an adult thigh cuff.  Try to rest or relax for at least 30 minutes before you check your blood pressure.  Do not smoke.  Do not have any drinks with caffeine, such as:  Pop.  Coffee.  Tea.  Check your blood pressure in a quiet room.  Sit down and stretch out your arm on a table. Keep your arm at about the level of your heart. Let your arm relax. GETTING BLOOD PRESSURE READINGS  Make sure you remove any tight-fighting clothing from your arm. Wrap the cuff around your upper arm. Wrap it just above the bend,  and above where you felt the pulse. You should be able to slip a finger between the cuff and your arm. If you cannot slip a finger in the cuff, it is too tight and  should be removed and rewrapped.  Some units requires you to manually pump up the arm cuff.  Automatic units inflate the cuff when you press a button.  Cuff deflation is automatic in both models.  After the cuff is inflated, the unit measures your blood pressure and pulse. The readings are displayed on a monitor. Hold still and breathe normally while the cuff is inflated.  Getting a reading takes less than a minute.  Some models store readings in a memory. Some provide a printout of readings.  Get readings at different times of the day. You should wait at least 5 minutes between readings. Take readings with you to your next doctor's visit. Document Released: 06/18/2008 Document Revised: 09/28/2011 Document Reviewed: 06/18/2008 [facility] Patient Information 2013 [place], [place].       Neta Mends. Earlene Bjelland M.D.  Health Maintenance  Topic Date Due  . Pneumococcal Polysaccharide Vaccine Age 29 And Over  04/08/2006  . Tetanus/tdap  07/20/2012  . Influenza Vaccine  03/20/2013  . Colonoscopy  09/06/2022  . Zostavax  Completed   Health Maintenance Review  By report think she has had  had pneumovax  Uncertain date   By hx  Poss in paper record would have been prpedateing EHR   Age 19

## 2012-11-14 NOTE — Patient Instructions (Addendum)
Continue lifestyle intervention healthy eating and exercise .  I think your sx are from anxiety .       consider adding activities such as yoga    Suggest get audiology to see you about your left ear if   persistent or progressive     How to Take Your Blood Pressure  These instructions are only for electronic home blood pressure machines. You will need:   An automatic or semi-automatic blood pressure machine.  Fresh batteries for the blood pressure machine. HOW DO I USE THESE TOOLS TO CHECK MY BLOOD PRESSURE?   There are 2 numbers that make up your blood pressure. For example: 120/80.  The first number (120 in our example) is called the "systolic pressure." It is a measure of the pressure in your blood vessels when your heart is pumping blood.  The second number (80 in our example) is called the "diastolic pressure." It is a measure of the pressure in your blood vessels when your heart is resting between beats.  Before you buy a home blood pressure machine, check the size of your arm so you can buy the right size cuff. Here is how to check the size of your arm:  Use a tape measure that shows both inches and centimeters.  Wrap the tape measure around the middle upper part of your arm. You may need someone to help you measure right.  Write down your arm measurement in both inches and centimeters.  To measure your blood pressure right, it is important to have the right size cuff.  If your arm is up to 13 inches (37 to 34 centimeters), get an adult cuff size.  If your arm is 13 to 17 inches (35 to 44 centimeters), get a large adult cuff size.  If your arm is 17 to 20 inches (45 to 52 centimeters), get an adult thigh cuff.  Try to rest or relax for at least 30 minutes before you check your blood pressure.  Do not smoke.  Do not have any drinks with caffeine, such as:  Pop.  Coffee.  Tea.  Check your blood pressure in a quiet room.  Sit down and stretch out your arm on  a table. Keep your arm at about the level of your heart. Let your arm relax. GETTING BLOOD PRESSURE READINGS  Make sure you remove any tight-fighting clothing from your arm. Wrap the cuff around your upper arm. Wrap it just above the bend, and above where you felt the pulse. You should be able to slip a finger between the cuff and your arm. If you cannot slip a finger in the cuff, it is too tight and should be removed and rewrapped.  Some units requires you to manually pump up the arm cuff.  Automatic units inflate the cuff when you press a button.  Cuff deflation is automatic in both models.  After the cuff is inflated, the unit measures your blood pressure and pulse. The readings are displayed on a monitor. Hold still and breathe normally while the cuff is inflated.  Getting a reading takes less than a minute.  Some models store readings in a memory. Some provide a printout of readings.  Get readings at different times of the day. You should wait at least 5 minutes between readings. Take readings with you to your next doctor's visit. Document Released: 06/18/2008 Document Revised: 09/28/2011 Document Reviewed: 06/18/2008 [facility] Patient Information 2013 [place], [place].

## 2012-11-19 DIAGNOSIS — R030 Elevated blood-pressure reading, without diagnosis of hypertension: Secondary | ICD-10-CM | POA: Insufficient documentation

## 2012-11-19 DIAGNOSIS — H6120 Impacted cerumen, unspecified ear: Secondary | ICD-10-CM | POA: Insufficient documentation

## 2012-11-21 ENCOUNTER — Other Ambulatory Visit: Payer: Self-pay | Admitting: Internal Medicine

## 2012-11-28 ENCOUNTER — Telehealth: Payer: Self-pay | Admitting: Internal Medicine

## 2012-11-28 NOTE — Telephone Encounter (Signed)
Pt would like come in for  Discuss getting tdap/tetanus however in immunization records patient had immunization 07[birth year]20-2002. Which can not be correct due to new years day 07-20-2002. Misty please verify last tetanus/tdap

## 2012-11-28 NOTE — Telephone Encounter (Signed)
Pt is aware waiting on nurse to verify last tetanus/tdap

## 2012-11-28 NOTE — Telephone Encounter (Signed)
It was a "historical Td"  2004 not sure if it is Tdap

## 2012-11-28 NOTE — Telephone Encounter (Signed)
Can you check the old system for a td/tdap on this patient?

## 2012-11-28 NOTE — Telephone Encounter (Signed)
Patient notified of all.

## 2012-12-21 ENCOUNTER — Other Ambulatory Visit: Payer: Self-pay | Admitting: Internal Medicine

## 2013-02-16 ENCOUNTER — Other Ambulatory Visit: Payer: Self-pay | Admitting: Internal Medicine

## 2013-04-06 ENCOUNTER — Other Ambulatory Visit: Payer: Self-pay | Admitting: Internal Medicine

## 2013-04-07 ENCOUNTER — Other Ambulatory Visit: Payer: Self-pay | Admitting: Family Medicine

## 2013-04-07 MED ORDER — CLORAZEPATE DIPOTASSIUM 7.5 MG PO TABS: ORAL_TABLET | ORAL | Status: DC

## 2013-04-11 IMAGING — CT CT PARANASAL SINUSES LIMITED
1 series · 16 of 18 positions shown, 20 images · non-contrast
Comparison: None.

CLINICAL DATA: Left periorbital swelling and redness.

CT LIMITED SINUSES WITHOUT CONTRAST
TECHNIQUE: Multidetector CT images of the paranasal sinuses were
obtained in a single plane without contrast.

[Series 4: ltd sinus 3.0 h30s · axial · 0.36mm/px · z∈[-119,-24]mm · 16 of 18 slices shown, 20 images]
[im 2/18  brain]
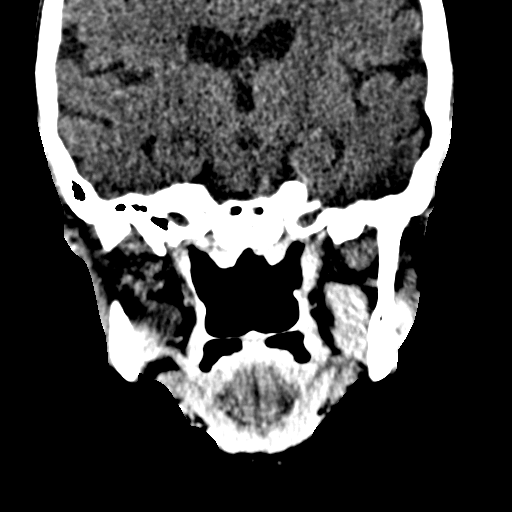
[im 2/18  bone]
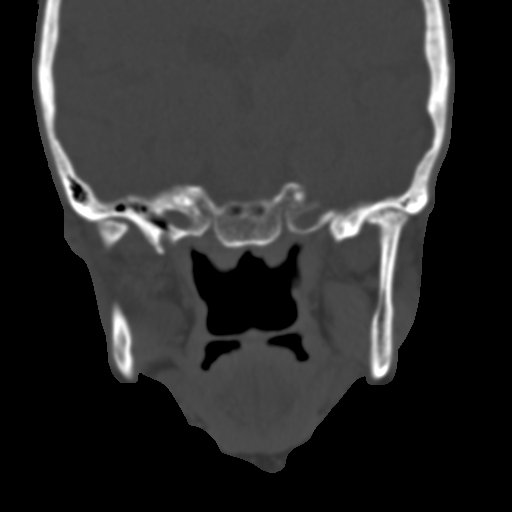
[im 3/18  bone]
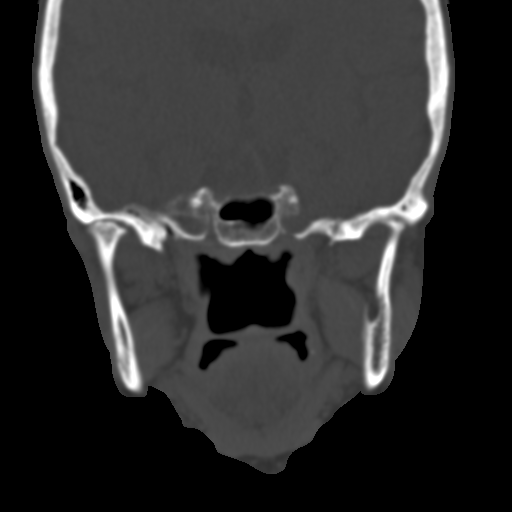
[im 4/18  bone]
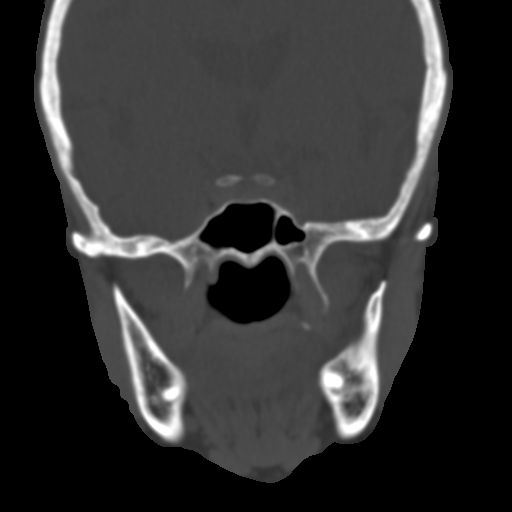
[im 5/18  bone]
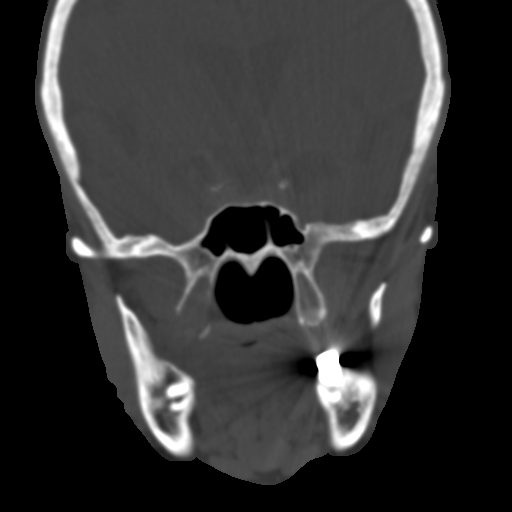
[im 6/18  brain]
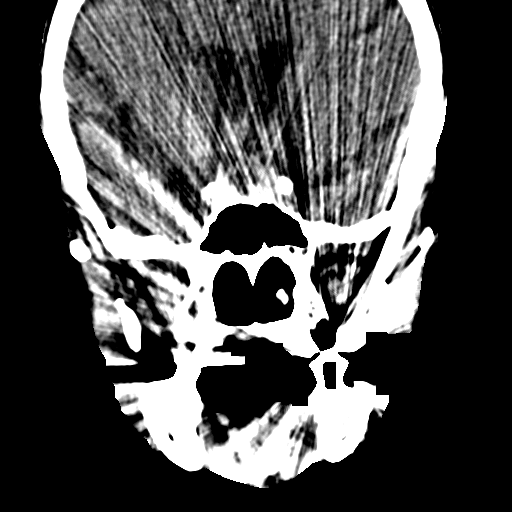
[im 6/18  bone]
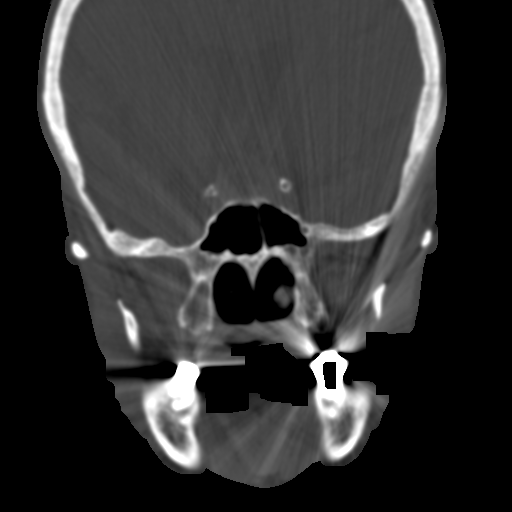
[im 7/18  bone]
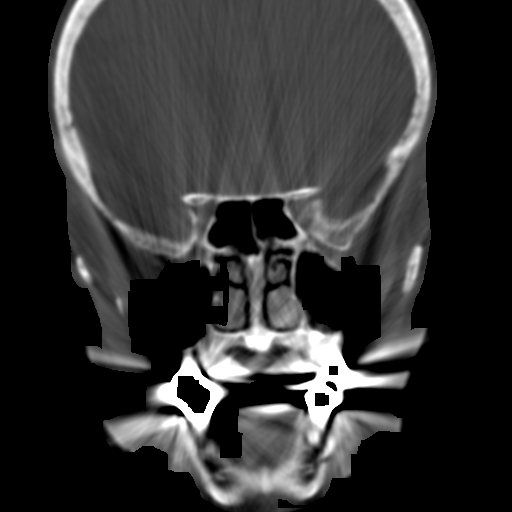
[im 8/18  bone]
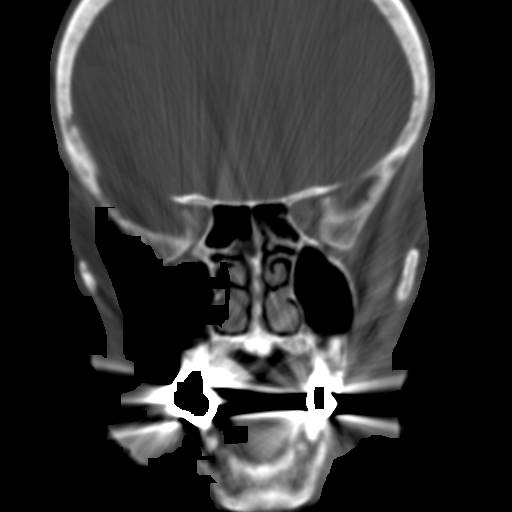
[im 9/18  bone]
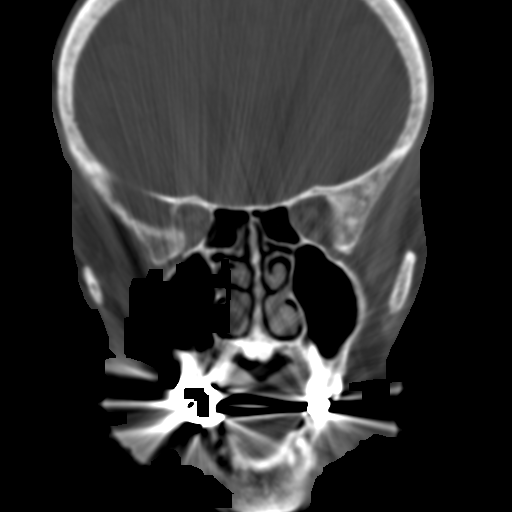
[im 10/18  brain]
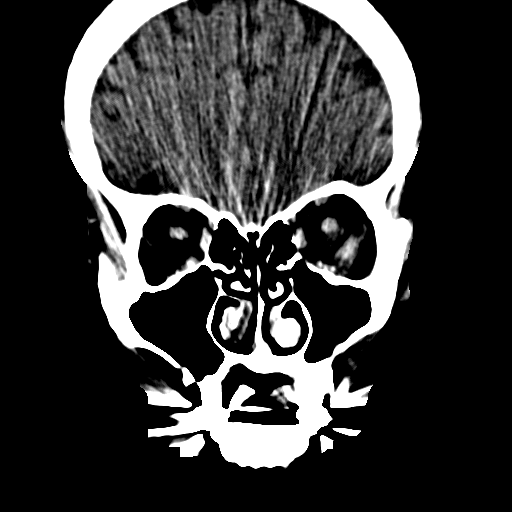
[im 10/18  bone]
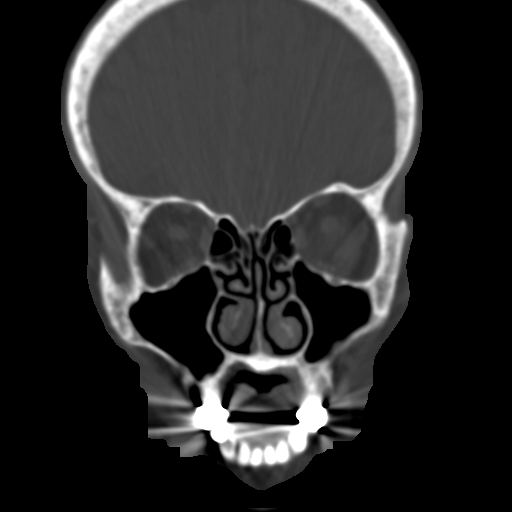
[im 11/18  bone]
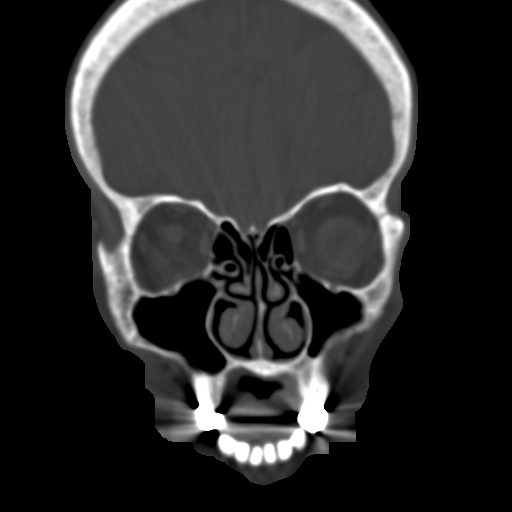
[im 12/18  bone]
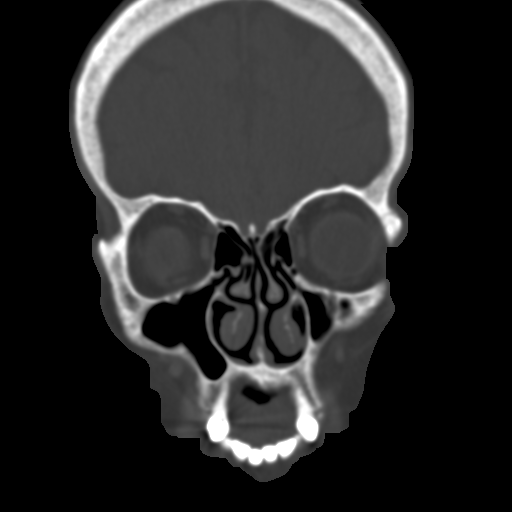
[im 13/18  bone]
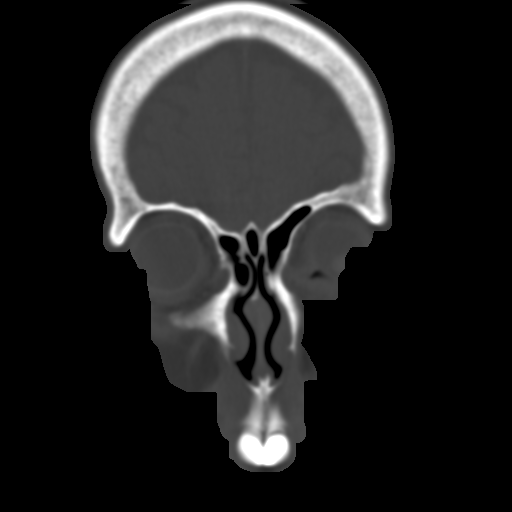
[im 14/18  brain]
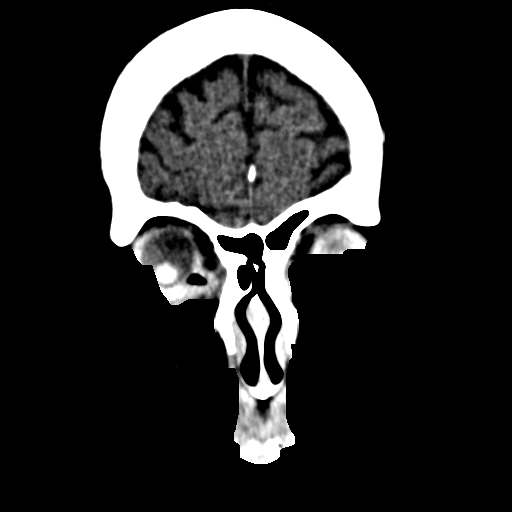
[im 14/18  bone]
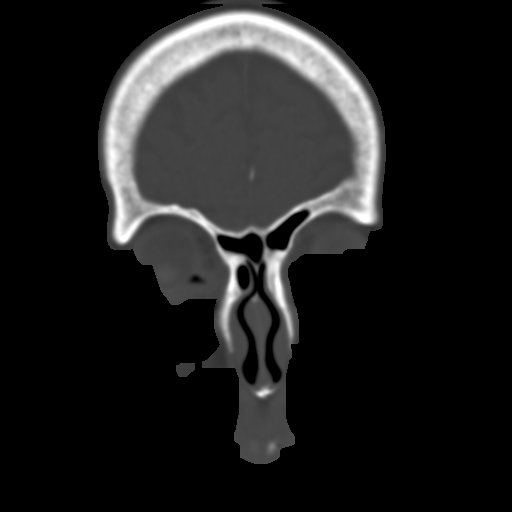
[im 15/18  bone]
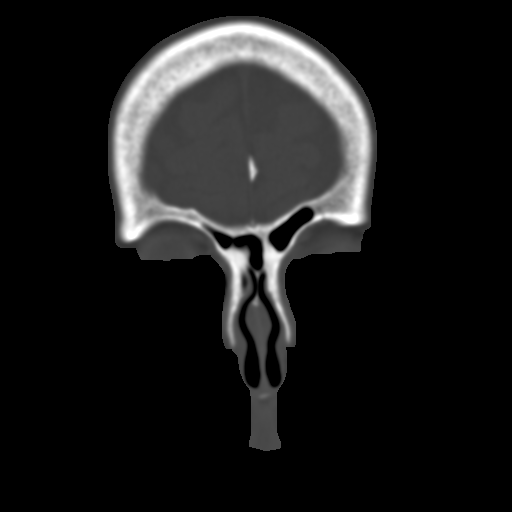
[im 16/18  bone]
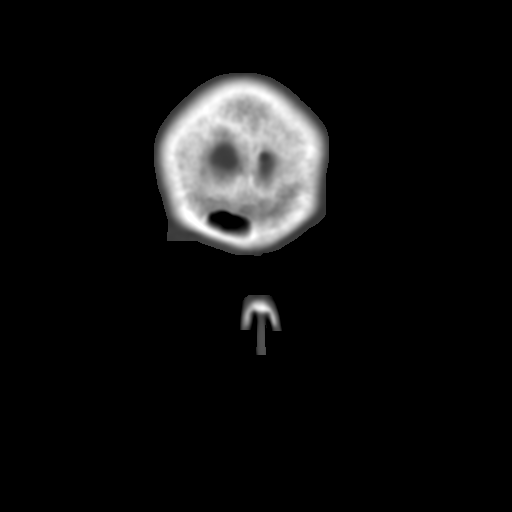
[im 17/18  bone]
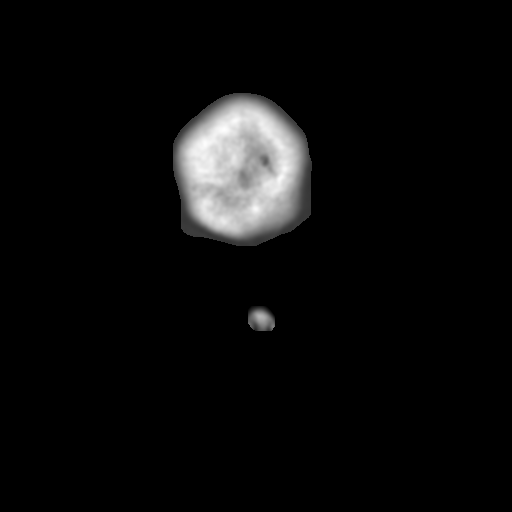

[16 of 18 positions shown; findings below may reference images not displayed]

FINDINGS: The paranasal sinuses appear clear.  No new bony
sclerosis or wall thickening is identified.  There is no concha
bullosa.  Visualized intracranial contents and orbits appear
normal.  Degenerative change at the temporal mandibular joints is
noted.
IMPRESSION: 1.  Negative for sinus disease or other finding to explain the
patient's symptoms.
2.  Bilateral TMJ degenerative change.

## 2013-05-15 ENCOUNTER — Encounter: Payer: Self-pay | Admitting: Internal Medicine

## 2013-05-15 ENCOUNTER — Ambulatory Visit (INDEPENDENT_AMBULATORY_CARE_PROVIDER_SITE_OTHER): Payer: Medicare Other | Admitting: Internal Medicine

## 2013-05-15 VITALS — BP 118/80 | HR 70 | Temp 98.1°F | Wt 142.0 lb

## 2013-05-15 DIAGNOSIS — J040 Acute laryngitis: Secondary | ICD-10-CM

## 2013-05-15 DIAGNOSIS — J22 Unspecified acute lower respiratory infection: Secondary | ICD-10-CM

## 2013-05-15 DIAGNOSIS — J988 Other specified respiratory disorders: Secondary | ICD-10-CM

## 2013-05-15 NOTE — Patient Instructions (Signed)
This seems like a viral respiratory illness ] Continue sx rx hot liquids rest .  Comfort meds Cough can last 2-3 weeks but without fever or shortness of breath.   Upper Respiratory Infection, Adult An upper respiratory infection (URI) is also sometimes known as the common cold. The upper respiratory tract includes the nose, sinuses, throat, trachea, and bronchi. Bronchi are the airways leading to the lungs. Most people improve within 1 week, but symptoms can last up to 2 weeks. A residual cough may last even longer.  CAUSES Many different viruses can infect the tissues lining the upper respiratory tract. The tissues become irritated and inflamed and often become very moist. Mucus production is also common. A cold is contagious. You can easily spread the virus to others by oral contact. This includes kissing, sharing a glass, coughing, or sneezing. Touching your mouth or nose and then touching a surface, which is then touched by another person, can also spread the virus. SYMPTOMS  Symptoms typically develop 1 to 3 days after you come in contact with a cold virus. Symptoms vary from person to person. They may include:  Runny nose.  Sneezing.  Nasal congestion.  Sinus irritation.  Sore throat.  Loss of voice (laryngitis).  Cough.  Fatigue.  Muscle aches.  Loss of appetite.  Headache.  Low-grade fever. DIAGNOSIS  You might diagnose your own cold based on familiar symptoms, since most people get a cold 2 to 3 times a year. Your caregiver can confirm this based on your exam. Most importantly, your caregiver can check that your symptoms are not due to another disease such as strep throat, sinusitis, pneumonia, asthma, or epiglottitis. Blood tests, throat tests, and X-rays are not necessary to diagnose a common cold, but they may sometimes be helpful in excluding other more serious diseases. Your caregiver will decide if any further tests are required. RISKS AND COMPLICATIONS  You may be  at risk for a more severe case of the common cold if you smoke cigarettes, have chronic heart disease (such as heart failure) or lung disease (such as asthma), or if you have a weakened immune system. The very young and very old are also at risk for more serious infections. Bacterial sinusitis, middle ear infections, and bacterial pneumonia can complicate the common cold. The common cold can worsen asthma and chronic obstructive pulmonary disease (COPD). Sometimes, these complications can require emergency medical care and may be life-threatening. PREVENTION  The best way to protect against getting a cold is to practice good hygiene. Avoid oral or hand contact with people with cold symptoms. Wash your hands often if contact occurs. There is no clear evidence that vitamin C, vitamin E, echinacea, or exercise reduces the chance of developing a cold. However, it is always recommended to get plenty of rest and practice good nutrition. TREATMENT  Treatment is directed at relieving symptoms. There is no cure. Antibiotics are not effective, because the infection is caused by a virus, not by bacteria. Treatment may include:  Increased fluid intake. Sports drinks offer valuable electrolytes, sugars, and fluids.  Breathing heated mist or steam (vaporizer or shower).  Eating chicken soup or other clear broths, and maintaining good nutrition.  Getting plenty of rest.  Using gargles or lozenges for comfort.  Controlling fevers with ibuprofen or acetaminophen as directed by your caregiver.  Increasing usage of your inhaler if you have asthma. Zinc gel and zinc lozenges, taken in the first 24 hours of the common cold, can shorten the duration  and lessen the severity of symptoms. Pain medicines may help with fever, muscle aches, and throat pain. A variety of non-prescription medicines are available to treat congestion and runny nose. Your caregiver can make recommendations and may suggest nasal or lung inhalers  for other symptoms.  HOME CARE INSTRUCTIONS   Only take over-the-counter or prescription medicines for pain, discomfort, or fever as directed by your caregiver.  Use a warm mist humidifier or inhale steam from a shower to increase air moisture. This may keep secretions moist and make it easier to breathe.  Drink enough water and fluids to keep your urine clear or pale yellow.  Rest as needed.  Return to work when your temperature has returned to normal or as your caregiver advises. You may need to stay home longer to avoid infecting others. You can also use a face mask and careful hand washing to prevent spread of the virus. SEEK MEDICAL CARE IF:   After the first few days, you feel you are getting worse rather than better.  You need your caregiver's advice about medicines to control symptoms.  You develop chills, worsening shortness of breath, or brown or red sputum. These may be signs of pneumonia.  You develop yellow or brown nasal discharge or pain in the face, especially when you bend forward. These may be signs of sinusitis.  You develop a fever, swollen neck glands, pain with swallowing, or white areas in the back of your throat. These may be signs of strep throat. SEEK IMMEDIATE MEDICAL CARE IF:   You have a fever.  You develop severe or persistent headache, ear pain, sinus pain, or chest pain.  You develop wheezing, a prolonged cough, cough up blood, or have a change in your usual mucus (if you have chronic lung disease).  You develop sore muscles or a stiff neck. Document Released: 12/30/2000 Document Revised: 09/28/2011 Document Reviewed: 11/07/2010 [facility] Patient Information 2014 [place], [place].

## 2013-05-16 ENCOUNTER — Ambulatory Visit: Payer: Medicare Other | Admitting: Internal Medicine

## 2013-05-20 DIAGNOSIS — J040 Acute laryngitis: Secondary | ICD-10-CM | POA: Insufficient documentation

## 2013-05-20 DIAGNOSIS — J22 Unspecified acute lower respiratory infection: Secondary | ICD-10-CM | POA: Insufficient documentation

## 2013-05-20 NOTE — Progress Notes (Signed)
Chief Complaint  Patient presents with  . Hoarse    Got back from [place] on 05/07/13.  Has not felt well.  Also having trouble getting of jet lag.  . Cough    HPI: Patient comes in today for SDA for  new problem evaluation. Above sx ever since trip back from [place] 10 19 fatigue coughing hoarse ongogin some clogged ears no fever no chills cp sob with this , cough dry hacking . Using otcs afrin  ROS: See pertinent positives and negatives per HPI.  Past Medical History  Diagnosis Date  . Generalized headaches     MRI head 2008 negative  008  . RLS (restless legs syndrome)   . Ovarian cyst     remote hx  . Anxiety   . GERD (gastroesophageal reflux disease)   . Hyperlipidemia     Family History  Problem Relation Age of Onset  . Pancreatic cancer Mother   . Diabetes Father   . Heart disease Father   . Hypertension Father   . Kidney disease Father   . Melanoma    . Kidney cancer Brother   . Colon cancer Neg Hx   . Stomach cancer Neg Hx     History   Social History  . Marital Status: Married    Spouse Name: N/A    Number of Children: N/A  . Years of Education: N/A   Social History Main Topics  . Smoking status: Never Smoker   . Smokeless tobacco: Never Used  . Alcohol Use: 8.4 oz/week    14 Glasses of wine per week  . Drug Use: No  . Sexual Activity: None   Other Topics Concern  . None   Social History Narrative   Retired   Married  h h of 2    Regular exercise-no   Cats   CB x 2                 Outpatient Encounter Prescriptions as of 05/15/2013  Medication Sig  . citalopram (CELEXA) 10 MG tablet TAKE 3 TABLETS EVERY DAY  . clorazepate (TRANXENE) 7.5 MG tablet TAKE 1-2 TABLETS EVERY DAY AS DIRECTED  . fluticasone (FLONASE) 50 MCG/ACT nasal spray PLACE 2 SPRAYS INTO THE NOSE DAILY.  Marland Kitchen lovastatin (MEVACOR) 20 MG tablet TAKE 1 TABLET EVERY DAY  . MOVIPREP 100 G SOLR   . MULTIPLE VITAMIN PO Take by mouth.  . pantoprazole  (PROTONIX) 40 MG tablet Take 1 tablet (40 mg total) by mouth daily.  . [DISCONTINUED] fluticasone (FLONASE) 50 MCG/ACT nasal spray Place 2 sprays into the nose daily.  . [DISCONTINUED] lovastatin (MEVACOR) 20 MG tablet Take 1 tablet (20 mg total) by mouth daily.    EXAM:  BP 118/80  Pulse 70  Temp(Src) 98.1 F (36.7 C) (Oral)  Wt 142 lb (64.411 kg)  BMI 23.46 kg/m2  SpO2 99%  Body mass index is 23.46 kg/(m^2). WDWN in NAD  quiet respirations; mildly congested  somewhat hoarse. Non toxic . HEENT: Normocephalic ;atraumatic , Eyes;  PERRL, EOMs  Full, lids and conjunctiva clear,,Ears: no deformities, canals nl, TM landmarks normal, Nose: no deformity or discharge but congested;face minimally tender Mouth : OP clear without lesion or edema . Neck: Supple without adenopathy or masses or bruits Chest:  Clear to A&P without wheezes rales or rhonchi CV:  S1-S2 no gallops or murmurs peripheral perfusion is normal Skin :nl perfusion and no acute rashes  PSYCH: pleasant and cooperative, no  obvious depression or anxiety  ASSESSMENT AND PLAN:  Discussed the following assessment and plan:  Acute respiratory infection  Acute viral laryngitis No evidence of resp compromise typical of similar illness in community -Patient advised to return or notify health care team  if symptoms worsen or persist or new concerns arise.  Patient Instructions  This seems like a viral respiratory illness ] Continue sx rx hot liquids rest .  Comfort meds Cough can last 2-3 weeks but without fever or shortness of breath.   Upper Respiratory Infection, Adult An upper respiratory infection (URI) is also sometimes known as the common cold. The upper respiratory tract includes the nose, sinuses, throat, trachea, and bronchi. Bronchi are the airways leading to the lungs. Most people improve within 1 week, but symptoms can last up to 2 weeks. A residual cough may last even longer.  CAUSES Many different viruses can  infect the tissues lining the upper respiratory tract. The tissues become irritated and inflamed and often become very moist. Mucus production is also common. A cold is contagious. You can easily spread the virus to others by oral contact. This includes kissing, sharing a glass, coughing, or sneezing. Touching your mouth or nose and then touching a surface, which is then touched by another person, can also spread the virus. SYMPTOMS  Symptoms typically develop 1 to 3 days after you come in contact with a cold virus. Symptoms vary from person to person. They may include:  Runny nose.  Sneezing.  Nasal congestion.  Sinus irritation.  Sore throat.  Loss of voice (laryngitis).  Cough.  Fatigue.  Muscle aches.  Loss of appetite.  Headache.  Low-grade fever. DIAGNOSIS  You might diagnose your own cold based on familiar symptoms, since most people get a cold 2 to 3 times a year. Your caregiver can confirm this based on your exam. Most importantly, your caregiver can check that your symptoms are not due to another disease such as strep throat, sinusitis, pneumonia, asthma, or epiglottitis. Blood tests, throat tests, and X-rays are not necessary to diagnose a common cold, but they may sometimes be helpful in excluding other more serious diseases. Your caregiver will decide if any further tests are required. RISKS AND COMPLICATIONS  You may be at risk for a more severe case of the common cold if you smoke cigarettes, have chronic heart disease (such as heart failure) or lung disease (such as asthma), or if you have a weakened immune system. The very young and very old are also at risk for more serious infections. Bacterial sinusitis, middle ear infections, and bacterial pneumonia can complicate the common cold. The common cold can worsen asthma and chronic obstructive pulmonary disease (COPD). Sometimes, these complications can require emergency medical care and may be  life-threatening. PREVENTION  The best way to protect against getting a cold is to practice good hygiene. Avoid oral or hand contact with people with cold symptoms. Wash your hands often if contact occurs. There is no clear evidence that vitamin C, vitamin E, echinacea, or exercise reduces the chance of developing a cold. However, it is always recommended to get plenty of rest and practice good nutrition. TREATMENT  Treatment is directed at relieving symptoms. There is no cure. Antibiotics are not effective, because the infection is caused by a virus, not by bacteria. Treatment may include:  Increased fluid intake. Sports drinks offer valuable electrolytes, sugars, and fluids.  Breathing heated mist or steam (vaporizer or shower).  Eating chicken soup or other clear  broths, and maintaining good nutrition.  Getting plenty of rest.  Using gargles or lozenges for comfort.  Controlling fevers with ibuprofen or acetaminophen as directed by your caregiver.  Increasing usage of your inhaler if you have asthma. Zinc gel and zinc lozenges, taken in the first 24 hours of the common cold, can shorten the duration and lessen the severity of symptoms. Pain medicines may help with fever, muscle aches, and throat pain. A variety of non-prescription medicines are available to treat congestion and runny nose. Your caregiver can make recommendations and may suggest nasal or lung inhalers for other symptoms.  HOME CARE INSTRUCTIONS   Only take over-the-counter or prescription medicines for pain, discomfort, or fever as directed by your caregiver.  Use a warm mist humidifier or inhale steam from a shower to increase air moisture. This may keep secretions moist and make it easier to breathe.  Drink enough water and fluids to keep your urine clear or pale yellow.  Rest as needed.  Return to work when your temperature has returned to normal or as your caregiver advises. You may need to stay home longer to  avoid infecting others. You can also use a face mask and careful hand washing to prevent spread of the virus. SEEK MEDICAL CARE IF:   After the first few days, you feel you are getting worse rather than better.  You need your caregiver's advice about medicines to control symptoms.  You develop chills, worsening shortness of breath, or brown or red sputum. These may be signs of pneumonia.  You develop yellow or brown nasal discharge or pain in the face, especially when you bend forward. These may be signs of sinusitis.  You develop a fever, swollen neck glands, pain with swallowing, or white areas in the back of your throat. These may be signs of strep throat. SEEK IMMEDIATE MEDICAL CARE IF:   You have a fever.  You develop severe or persistent headache, ear pain, sinus pain, or chest pain.  You develop wheezing, a prolonged cough, cough up blood, or have a change in your usual mucus (if you have chronic lung disease).  You develop sore muscles or a stiff neck. Document Released: 12/30/2000 Document Revised: 09/28/2011 Document Reviewed: 11/07/2010 [facility] Patient Information 2014 [place], [place].      Neta Mends. Panosh M.D.

## 2013-05-25 ENCOUNTER — Other Ambulatory Visit: Payer: Self-pay

## 2013-07-22 ENCOUNTER — Other Ambulatory Visit: Payer: Self-pay | Admitting: Internal Medicine

## 2013-08-11 ENCOUNTER — Other Ambulatory Visit: Payer: Self-pay | Admitting: Internal Medicine

## 2013-08-14 ENCOUNTER — Other Ambulatory Visit: Payer: Self-pay | Admitting: Internal Medicine

## 2013-08-15 ENCOUNTER — Other Ambulatory Visit: Payer: Self-pay | Admitting: Internal Medicine

## 2013-09-12 ENCOUNTER — Other Ambulatory Visit: Payer: Self-pay | Admitting: Internal Medicine

## 2013-11-06 ENCOUNTER — Other Ambulatory Visit: Payer: Self-pay | Admitting: Internal Medicine

## 2013-11-08 ENCOUNTER — Other Ambulatory Visit: Payer: Medicare Other

## 2013-11-15 ENCOUNTER — Ambulatory Visit (INDEPENDENT_AMBULATORY_CARE_PROVIDER_SITE_OTHER): Payer: Medicare Other | Admitting: Internal Medicine

## 2013-11-15 ENCOUNTER — Encounter: Payer: Self-pay | Admitting: Internal Medicine

## 2013-11-15 VITALS — BP 140/80 | HR 62 | Temp 97.9°F | Ht 65.0 in | Wt 144.0 lb

## 2013-11-15 DIAGNOSIS — F411 Generalized anxiety disorder: Secondary | ICD-10-CM

## 2013-11-15 DIAGNOSIS — Z23 Encounter for immunization: Secondary | ICD-10-CM

## 2013-11-15 DIAGNOSIS — Z0000 Encounter for general adult medical examination without abnormal findings: Secondary | ICD-10-CM

## 2013-11-15 DIAGNOSIS — E782 Mixed hyperlipidemia: Secondary | ICD-10-CM

## 2013-11-15 DIAGNOSIS — K219 Gastro-esophageal reflux disease without esophagitis: Secondary | ICD-10-CM

## 2013-11-15 DIAGNOSIS — R7309 Other abnormal glucose: Secondary | ICD-10-CM

## 2013-11-15 LAB — HEPATIC FUNCTION PANEL
ALK PHOS: 37 U/L — AB (ref 39–117)
ALT: 34 U/L (ref 0–35)
AST: 36 U/L (ref 0–37)
Albumin: 4.5 g/dL (ref 3.5–5.2)
BILIRUBIN TOTAL: 0.4 mg/dL (ref 0.3–1.2)
Bilirubin, Direct: 0 mg/dL (ref 0.0–0.3)
Total Protein: 7 g/dL (ref 6.0–8.3)

## 2013-11-15 LAB — CBC WITH DIFFERENTIAL/PLATELET
BASOS ABS: 0 10*3/uL (ref 0.0–0.1)
Basophils Relative: 0.6 % (ref 0.0–3.0)
EOS PCT: 4.1 % (ref 0.0–5.0)
Eosinophils Absolute: 0.3 10*3/uL (ref 0.0–0.7)
HCT: 39.2 % (ref 36.0–46.0)
Hemoglobin: 13.3 g/dL (ref 12.0–15.0)
LYMPHS PCT: 16.1 % (ref 12.0–46.0)
Lymphs Abs: 1.2 10*3/uL (ref 0.7–4.0)
MCHC: 33.8 g/dL (ref 30.0–36.0)
MCV: 92.1 fl (ref 78.0–100.0)
MONOS PCT: 7.7 % (ref 3.0–12.0)
Monocytes Absolute: 0.6 10*3/uL (ref 0.1–1.0)
NEUTROS PCT: 71.5 % (ref 43.0–77.0)
Neutro Abs: 5.4 10*3/uL (ref 1.4–7.7)
PLATELETS: 310 10*3/uL (ref 150.0–400.0)
RBC: 4.26 Mil/uL (ref 3.87–5.11)
RDW: 14 % (ref 11.5–14.6)
WBC: 7.5 10*3/uL (ref 4.5–10.5)

## 2013-11-15 LAB — BASIC METABOLIC PANEL
BUN: 20 mg/dL (ref 6–23)
CALCIUM: 9.4 mg/dL (ref 8.4–10.5)
CO2: 27 mEq/L (ref 19–32)
Chloride: 103 mEq/L (ref 96–112)
Creatinine, Ser: 0.8 mg/dL (ref 0.4–1.2)
GFR: 79.37 mL/min (ref >60.00)
GLUCOSE: 113 mg/dL — AB (ref 70–99)
POTASSIUM: 4 meq/L (ref 3.5–5.1)
Sodium: 138 mEq/L (ref 135–145)

## 2013-11-15 LAB — LIPID PANEL
Cholesterol: 231 mg/dL — ABNORMAL HIGH (ref 0–200)
HDL: 96 mg/dL (ref >39.00)
LDL CALC: 122 mg/dL — AB (ref 0–99)
Total CHOL/HDL Ratio: 2
Triglycerides: 66 mg/dL (ref 0.0–149.0)
VLDL: 13.2 mg/dL (ref 0.0–40.0)

## 2013-11-15 LAB — HEMOGLOBIN A1C: HEMOGLOBIN A1C: 5.8 % (ref 4.6–6.5)

## 2013-11-15 LAB — TSH: TSH: 1.54 u[IU]/mL (ref 0.35–5.50)

## 2013-11-15 MED ORDER — PANTOPRAZOLE SODIUM 40 MG PO TBEC: DELAYED_RELEASE_TABLET | ORAL | Status: DC

## 2013-11-15 MED ORDER — LOVASTATIN 20 MG PO TABS: ORAL_TABLET | ORAL | Status: DC

## 2013-11-15 MED ORDER — FLUTICASONE PROPIONATE 50 MCG/ACT NA SUSP: NASAL | Status: DC

## 2013-11-15 MED ORDER — CLORAZEPATE DIPOTASSIUM 7.5 MG PO TABS: ORAL_TABLET | ORAL | Status: DC

## 2013-11-15 MED ORDER — CITALOPRAM HYDROBROMIDE 10 MG PO TABS: ORAL_TABLET | ORAL | Status: DC

## 2013-11-15 NOTE — Progress Notes (Signed)
Chief Complaint  Patient presents with  . Medicare Wellness    HPI: Patient comes in today for Preventive Medicare wellness visit . No major injuries, ed visits ,hospitalizations , new medications since last visit. Using  traxene a few times a week 1/4 pill.  Uses it prn.  Panic attacks.  Chest sx when anxious otherwise exercise is good and feels healthy Get anxious when  Visitors come and preparing  Tends to be perfectionist.  Lipid med no se  GERD only takes proton as needed Allergies stable on nasal spray  Health Maintenance  Topic Date Due  . Mammogram  07/20/2012  . Influenza Vaccine  02/17/2014  . Colonoscopy  09/06/2022  . Tetanus/tdap  11/16/2023  . Pneumococcal Polysaccharide Vaccine Age 73 And Over  Completed  . Zostavax  Completed   Health Maintenance Review   Hearing:  Ok   Vision:  No limitations at present . Last eye check UTD  Safety:  Has smoke detector and wears seat belts.  No firearms. No excess sun exposure. Sees dentist regularly.  Falls: no  Advance directive :  Reviewed   Memory: Felt to be good  , no concern from her or her family.  Depression: No anhedonia unusual crying or depressive symptoms ok on meds   Nutrition: Eats well balanced diet; adequate calcium and vitamin D. No swallowing chewing problems.  Injury: no major injuries in the last six months. Back bothers her when in yard  Other healthcare providers:  Reviewed today .  Social:  Lives with spouse married. No pets.  Kitty died .  Preventive parameters: up-to-date  Reviewed   ADLS:   There are no problems or need for assistance  driving, feeding, obtaining food, dressing, toileting and bathing, managing money using phone. She is independent.  EXERCISE/ HABITS  Treadmill 30 minutes or outside Per week   No tobacco ,red wine 3 per day.    etoh.   ROS:  GEN/ HEENT: No fever, significant weight changes sweats headaches vision problems hearing changes, CV/ PULM; No chest pain  shortness of breath cough, syncope,edema  change in exercise tolerance. GI /GU: No adominal pain, vomiting, change in bowel habits. No blood in the stool. No significant GU symptoms. SKIN/HEME: ,no acute skin rashes suspicious lesions or bleeding. No lymphadenopathy, nodules, masses.  NEURO/ PSYCH:  No neurologic signs such as weakness numbness. No depression IMM/ Allergy: No unusual infections.  Allergy .   REST of 12 system review negative except as per HPI   Past Medical History  Diagnosis Date  . Generalized headaches     MRI head 2008 negative  008  . RLS (restless legs syndrome)   . Ovarian cyst     remote hx  . Anxiety   . GERD (gastroesophageal reflux disease)   . Hyperlipidemia     Family History  Problem Relation Age of Onset  . Pancreatic cancer Mother   . Diabetes Father   . Heart disease Father   . Hypertension Father   . Kidney disease Father   . Melanoma    . Kidney cancer Brother   . Colon cancer Neg Hx   . Stomach cancer Neg Hx     History   Social History  . Marital Status: Married    Spouse Name: N/A    Number of Children: N/A  . Years of Education: N/A   Social History Main Topics  . Smoking status: Never Smoker   . Smokeless tobacco: Never Used  .  Alcohol Use: 8.4 oz/week    14 Glasses of wine per week  . Drug Use: No  . Sexual Activity: None   Other Topics Concern  . None   Social History Narrative   Retired   Married  h h of 2    Regular exercise-no   Cats   CB x 2     Outpatient Encounter Prescriptions as of 11/15/2013  Medication Sig  . citalopram (CELEXA) 10 MG tablet TAKE 3 TABLETS EVERY DAY  . clorazepate (TRANXENE) 7.5 MG tablet TAKE 1-2 TABLETS if needed as directed q d  . fluticasone (FLONASE) 50 MCG/ACT nasal spray PLACE 2 SPRAYS INTO THE NOSE DAILY.  Marland Kitchen lovastatin (MEVACOR) 20 MG tablet TAKE 1 TABLET EVERY DAY  . MULTIPLE VITAMIN PO Take by mouth.  . pantoprazole (PROTONIX) 40 MG tablet TAKE 1 TABLET BY MOUTH EVERY DAY    . [DISCONTINUED] citalopram (CELEXA) 10 MG tablet TAKE 3 TABLETS EVERY DAY  . [DISCONTINUED] clorazepate (TRANXENE) 7.5 MG tablet TAKE 1-2 TABLETS EVERY DAY AS DIRECTED  . [DISCONTINUED] fluticasone (FLONASE) 50 MCG/ACT nasal spray PLACE 2 SPRAYS INTO THE NOSE DAILY.  . [DISCONTINUED] lovastatin (MEVACOR) 20 MG tablet TAKE 1 TABLET EVERY DAY  . [DISCONTINUED] pantoprazole (PROTONIX) 40 MG tablet TAKE 1 TABLET BY MOUTH EVERY DAY  . MOVIPREP 100 G SOLR   . [DISCONTINUED] pantoprazole (PROTONIX) 40 MG tablet TAKE 1 TABLET BY MOUTH EVERY DAY    EXAM:  BP 140/80  Pulse 62  Temp(Src) 97.9 F (36.6 C) (Oral)  Ht 5\' 5"  (1.651 m)  Wt 144 lb (65.318 kg)  BMI 23.96 kg/m2  SpO2 98%  Body mass index is 23.96 kg/(m^2).  Physical Exam: Vital signs reviewed WJX:BJYN is a well-developed well-nourished alert cooperative   who appears stated age in no acute distress.  HEENT: normocephalic atraumatic , Eyes: PERRL EOM's full, conjunctiva clear, Nares: paten,t no deformity discharge or tenderness., Ears: no deformity EAC's clear TMs with normal landmarks. Mouth: clear OP, no lesions, edema.  Moist mucous membranes. Dentition in adequate repair. NECK: supple without masses, thyromegaly or bruits. Breast: normal by inspection . No dimpling, discharge, masses, tenderness or discharge . CHEST/PULM:  Clear to auscultation and percussion breath sounds equal no wheeze , rales or rhonchi. No chest wall deformities or tenderness. CV: PMI is nondisplaced, S1 S2 no gallops, murmurs, rubs. Peripheral pulses are full without delay.No JVD .  ABDOMEN: Bowel sounds normal nontender  No guard or rebound, no hepato splenomegal no CVA tenderness.  No hernia. Extremtities:  No clubbing cyanosis or edema, no acute joint swelling or redness no focal atrophy mild oa changes  NEURO:  Oriented x3, cranial nerves 3-12 appear to be intact, no obvious focal weakness,gait within normal limits no abnormal reflexes or  asymmetrical SKIN: No acute rashes normal turgor, color, no bruising or petechiae. PSYCH: Oriented, good eye contact, no obvious depression anxiety, cognition and judgment appear normal. LN: no cervical axillary inguinal adenopathy No noted deficits in memory, attention, and speech.   Lab Results  Component Value Date   WBC 7.5 11/15/2013   HGB 13.3 11/15/2013   HCT 39.2 11/15/2013   PLT 310.0 11/15/2013   GLUCOSE 113* 11/15/2013   CHOL 231* 11/15/2013   TRIG 66.0 11/15/2013   HDL 96.00 11/15/2013   LDLDIRECT 146.6 11/14/2012   LDLCALC 122* 11/15/2013   ALT 34 11/15/2013   AST 36 11/15/2013   NA 138 11/15/2013   K 4.0 11/15/2013   CL 103 11/15/2013  CREATININE 0.8 11/15/2013   BUN 20 11/15/2013   CO2 27 11/15/2013   TSH 1.54 11/15/2013   HGBA1C 5.8 11/15/2013    ASSESSMENT AND PLAN:  Discussed the following assessment and plan:  Visit for preventive health examination - td and prvnar today disc risk benefot of mammo. aware of etoh moderation etc . cont healhty ls - Plan: Basic metabolic panel, CBC with Differential, Hemoglobin A1c, Hepatic function panel, Lipid panel, TSH  Medicare annual wellness visit, subsequent - Plan: Basic metabolic panel, CBC with Differential, Hemoglobin A1c, Hepatic function panel, Lipid panel, TSH  HYPERLIPIDEMIA - no problems lovastatin.  - Plan: Basic metabolic panel, CBC with Differential, Hemoglobin A1c, Hepatic function panel, Lipid panel, TSH  ANXIETY STATE NOS - caution iwht med  - Plan: Basic metabolic panel, CBC with Differential, Hemoglobin A1c, Hepatic function panel, Lipid panel, TSH  GERD - not reg use can control also with diet - Plan: Basic metabolic panel, CBC with Differential, Hemoglobin A1c, Hepatic function panel, Lipid panel, TSH  HYPERGLYCEMIA - lsi check a1c today  - Plan: Basic metabolic panel, CBC with Differential, Hemoglobin A1c, Hepatic function panel, Lipid panel, TSH  Need for vaccination with 13-polyvalent pneumococcal conjugate  vaccine - Plan: Pneumococcal conjugate vaccine 13-valent  Need for tetanus booster - Plan: Tetanus vaccine IM  Patient Care Team: Madelin HeadingsWanda K Lizzy Hamre, MD as PCP - General Serena ColonelJefry Rosen, MD Noralee StainLaura L Lomax, MD as Attending Physician (Dermatology) Vincenza HewsSigmund S Gould, MD (Ophthalmology) Serena ColonelJefry Rosen, MD as Attending Physician (Otolaryngology)  Patient Instructions  Will notify you  of labs when available. Continue lifestyle intervention healthy eating and exercise . Immunizations as above today  benefot more than risk of mammogram at your age.   Call for routine mammogran if you wish. rov 6 months for med check or as needed   Neta MendsWanda K. Taylorann Tkach M.D.    Pre visit review using our clinic review tool, if applicable. No additional management support is needed unless otherwise documented below in the visit note.

## 2013-11-15 NOTE — Patient Instructions (Addendum)
Will notify you  of labs when available. Continue lifestyle intervention healthy eating and exercise . Immunizations as above today  benefot more than risk of mammogram at your age.   Call for routine mammogran if you wish. rov 6 months for med check or as needed

## 2013-12-06 ENCOUNTER — Telehealth: Payer: Self-pay | Admitting: Internal Medicine

## 2013-12-06 MED ORDER — CITALOPRAM HYDROBROMIDE 10 MG PO TABS: ORAL_TABLET | ORAL | Status: DC

## 2013-12-06 NOTE — Telephone Encounter (Signed)
Sent to the pharmacy by e-scribe. 

## 2013-12-06 NOTE — Telephone Encounter (Signed)
Pt req rx citalopram (CELEXA) 10 MG tablet   She said rx need to be for a year   Pharmacy  [facility] [place]

## 2014-01-29 ENCOUNTER — Encounter: Payer: Self-pay | Admitting: Internal Medicine

## 2014-05-15 ENCOUNTER — Encounter: Payer: Self-pay | Admitting: Internal Medicine

## 2014-05-15 ENCOUNTER — Other Ambulatory Visit: Payer: Self-pay | Admitting: Internal Medicine

## 2014-05-15 ENCOUNTER — Ambulatory Visit (INDEPENDENT_AMBULATORY_CARE_PROVIDER_SITE_OTHER): Payer: Medicare Other | Admitting: Internal Medicine

## 2014-05-15 VITALS — BP 146/84 | Temp 98.5°F | Ht 65.0 in | Wt 139.0 lb

## 2014-05-15 DIAGNOSIS — Z79899 Other long term (current) drug therapy: Secondary | ICD-10-CM

## 2014-05-15 DIAGNOSIS — F4322 Adjustment disorder with anxiety: Secondary | ICD-10-CM

## 2014-05-15 DIAGNOSIS — R030 Elevated blood-pressure reading, without diagnosis of hypertension: Secondary | ICD-10-CM

## 2014-05-15 DIAGNOSIS — E782 Mixed hyperlipidemia: Secondary | ICD-10-CM

## 2014-05-15 DIAGNOSIS — Z6379 Other stressful life events affecting family and household: Secondary | ICD-10-CM

## 2014-05-15 MED ORDER — CITALOPRAM HYDROBROMIDE 20 MG PO TABS: 40.0000 mg | ORAL_TABLET | Freq: Every day | ORAL | Status: DC

## 2014-05-15 MED ORDER — CLORAZEPATE DIPOTASSIUM 7.5 MG PO TABS: ORAL_TABLET | ORAL | Status: DC

## 2014-05-15 NOTE — Patient Instructions (Signed)
Increase the citalopram to 40 mg per day. Check bp readings to be sure at goal .  Preventive and medication  In 6 months

## 2014-05-15 NOTE — Progress Notes (Signed)
Pre visit review using our clinic review tool, if applicable. No additional management support is needed unless otherwise documented below in the visit note.  Chief Complaint  Patient presents with  . Follow-up    HPI: Cynthia Floyd is 73 y.o. comes in for  Bp has  Been ok at home .goes up when anxious .  usually 130 range  Husband has prostate cancer to seeds implanted. Stress from that but doing ok . Has noted increase need recently for rescue med tranxene once a day at times . Helps a good bit. Thinks the 30 mg citalopram not as effective . Gi stable  Lipid no se of meds noted   ROS: See pertinent positives and negatives per HPI.no current cp sob gets tightness with anxiety no syncope depression gets tichy spot ll back   Past Medical History  Diagnosis Date  . Generalized headaches     MRI head 2008 negative  008  . RLS (restless legs syndrome)   . Ovarian cyst     remote hx  . Anxiety   . GERD (gastroesophageal reflux disease)   . Hyperlipidemia     Family History  Problem Relation Age of Onset  . Pancreatic cancer Mother   . Diabetes Father   . Heart disease Father   . Hypertension Father   . Kidney disease Father   . Melanoma    . Kidney cancer Brother   . Colon cancer Neg Hx   . Stomach cancer Neg Hx     History   Social History  . Marital Status: Married    Spouse Name: N/A    Number of Children: N/A  . Years of Education: N/A   Social History Main Topics  . Smoking status: Never Smoker   . Smokeless tobacco: Never Used  . Alcohol Use: 8.4 oz/week    14 Glasses of wine per week  . Drug Use: No  . Sexual Activity: None   Other Topics Concern  . None   Social History Narrative   Retired   Married  h h of 2    Regular exercise-no   Cats   CB x 2     Outpatient Encounter Prescriptions as of 05/15/2014  Medication Sig  . clorazepate (TRANXENE) 7.5 MG tablet TAKE 1-2 TABLETS if needed as directed q d  . fluticasone (FLONASE) 50 MCG/ACT  nasal spray PLACE 2 SPRAYS INTO THE NOSE DAILY.  Marland Kitchen. lovastatin (MEVACOR) 20 MG tablet TAKE 1 TABLET EVERY DAY  . MULTIPLE VITAMIN PO Take by mouth.  . pantoprazole (PROTONIX) 40 MG tablet TAKE 1 TABLET BY MOUTH EVERY DAY  . [DISCONTINUED] citalopram (CELEXA) 10 MG tablet TAKE 3 TABLETS EVERY DAY  . [DISCONTINUED] clorazepate (TRANXENE) 7.5 MG tablet TAKE 1-2 TABLETS if needed as directed q d  . citalopram (CELEXA) 20 MG tablet Take 2 tablets (40 mg total) by mouth daily.  Marland Kitchen. MOVIPREP 100 G SOLR     EXAM:  BP 146/84  Temp(Src) 98.5 F (36.9 C) (Oral)  Ht 5\' 5"  (1.651 m)  Wt 139 lb (63.05 kg)  BMI 23.13 kg/m2  Body mass index is 23.13 kg/(m^2).  GENERAL: vitals reviewed and listed above, alert, oriented, appears well hydrated and in no acute distress HEENT: atraumatic, conjunctiva  clear, no obvious abnormalities on inspection of external nose and ears  NECK: no obvious masses on inspection palpation  LUNGS: clear to auscultation bilaterally, no wheezes, rales or rhonchi, good air movement CV: HRRR, no clubbing cyanosis  or  peripheral edema nl cap refill  MS: moves all extremities without noticeable focal  Abnormality Skin no rash lower back area of itchy PSYCH: pleasant and cooperative, no obvious depression nl speech affect  cognition neuro appears non focal  Lab Results  Component Value Date   WBC 7.5 11/15/2013   HGB 13.3 11/15/2013   HCT 39.2 11/15/2013   PLT 310.0 11/15/2013   GLUCOSE 113* 11/15/2013   CHOL 231* 11/15/2013   TRIG 66.0 11/15/2013   HDL 96.00 11/15/2013   LDLDIRECT 146.6 11/14/2012   LDLCALC 122* 11/15/2013   ALT 34 11/15/2013   AST 36 11/15/2013   NA 138 11/15/2013   K 4.0 11/15/2013   CL 103 11/15/2013   CREATININE 0.8 11/15/2013   BUN 20 11/15/2013   CO2 27 11/15/2013   TSH 1.54 11/15/2013   HGBA1C 5.8 11/15/2013    ASSESSMENT AND PLAN:  Discussed the following assessment and plan:  Adjustment disorder with anxious mood - increase to 40 mg celexa  for now  rescue as needed. wellness6 months with progres report and earlier fu if needed.  Elevated blood pressure reading - pt states at goal  out of office   Mixed hyperlipidemia  Stress due to illness of family member  Medication management Refilled tranxene today -Patient advised to return or notify health care team  if symptoms worsen ,persist or new concerns arise.  Patient Instructions  Increase the citalopram to 40 mg per day. Check bp readings to be sure at goal .  Preventive and medication  In 6 months    Neta MendsWanda K. Floyd M.D.  Total visit 25mins > 50% spent counseling and coordinating care

## 2014-05-16 ENCOUNTER — Encounter: Payer: Self-pay | Admitting: Internal Medicine

## 2014-05-16 DIAGNOSIS — F4322 Adjustment disorder with anxiety: Secondary | ICD-10-CM | POA: Insufficient documentation

## 2014-06-12 ENCOUNTER — Telehealth: Payer: Self-pay | Admitting: Internal Medicine

## 2014-06-12 MED ORDER — CITALOPRAM HYDROBROMIDE 20 MG PO TABS
40.0000 mg | ORAL_TABLET | Freq: Every day | ORAL | Status: DC
Start: 2014-06-12 — End: 2015-01-13

## 2014-06-12 NOTE — Telephone Encounter (Signed)
Sent to the pharmacy by e-scribe. 

## 2014-06-12 NOTE — Telephone Encounter (Signed)
[id number]- [place], [place] - [address] [phone] is requesting 90 day re-fill on citalopram (CELEXA) 20 MG tablet

## 2014-06-18 ENCOUNTER — Other Ambulatory Visit: Payer: Self-pay | Admitting: Internal Medicine

## 2014-06-18 NOTE — Telephone Encounter (Signed)
Given a written rx on 05/15/14.

## 2014-08-24 ENCOUNTER — Encounter: Payer: Self-pay | Admitting: Internal Medicine

## 2014-08-24 ENCOUNTER — Ambulatory Visit (INDEPENDENT_AMBULATORY_CARE_PROVIDER_SITE_OTHER): Payer: Medicare Other | Admitting: Internal Medicine

## 2014-08-24 VITALS — BP 140/88 | Temp 97.9°F | Ht 64.75 in | Wt 145.1 lb

## 2014-08-24 DIAGNOSIS — R030 Elevated blood-pressure reading, without diagnosis of hypertension: Secondary | ICD-10-CM

## 2014-08-24 DIAGNOSIS — F410 Panic disorder [episodic paroxysmal anxiety] without agoraphobia: Secondary | ICD-10-CM | POA: Insufficient documentation

## 2014-08-24 DIAGNOSIS — R413 Other amnesia: Secondary | ICD-10-CM

## 2014-08-24 DIAGNOSIS — Z79899 Other long term (current) drug therapy: Secondary | ICD-10-CM

## 2014-08-24 NOTE — Patient Instructions (Addendum)
Anxiety can cause memory lapses  but because  of the severity of issues  an episodes .    We will get consult from neurology .  ? If the tranxene could cause also .  Memory issues . May need to need to change medication and also counseling .  Also may  address  Elevated  BP    Readings  and medication may be helpful .  Take blood pressure readings twice a day for 7- 10 days and then periodically .To ensure below 140/90   .Send in readings     decide on this .   I also advise  Counseling to help with the anxiety that may be related to external factors .      [facility]  [facility] also  Alan RipperClaire huprich or [employer]ulie marshall  Check insurance network from your carrier.  [email or web address]
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
## 2014-09-17 ENCOUNTER — Telehealth: Payer: Self-pay | Admitting: *Deleted

## 2014-09-17 NOTE — Telephone Encounter (Signed)
Pt called with questions about her referral please advise pt states she has a appt with Dr. Willodean Rosenthaleno urology 10/03/14.

## 2014-09-17 NOTE — Telephone Encounter (Signed)
Noted  

## 2014-10-03 ENCOUNTER — Encounter: Payer: Self-pay | Admitting: Neurology

## 2014-10-03 ENCOUNTER — Ambulatory Visit (INDEPENDENT_AMBULATORY_CARE_PROVIDER_SITE_OTHER): Payer: Medicare Other | Admitting: Neurology

## 2014-10-03 VITALS — BP 100/68 | HR 80 | Ht 65.0 in | Wt 139.3 lb

## 2014-10-03 DIAGNOSIS — R413 Other amnesia: Secondary | ICD-10-CM

## 2014-10-03 DIAGNOSIS — E785 Hyperlipidemia, unspecified: Secondary | ICD-10-CM

## 2014-10-03 LAB — VITAMIN B12: Vitamin B-12: 584 pg/mL (ref 211–911)

## 2014-10-03 LAB — TSH: TSH: 1.229 u[IU]/mL (ref 0.350–4.500)

## 2014-10-03 NOTE — Patient Instructions (Signed)
1. Bloodwork for TSH, B12 2. Schedule MRI brain without contrast 3. Let our office or Dr. Fabian SharpPanosh know if and when you would like to proceed with counseling 4. Follow-up in 6 months
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
## 2014-10-04 ENCOUNTER — Telehealth: Payer: Self-pay | Admitting: Family Medicine

## 2014-10-04 NOTE — Telephone Encounter (Signed)
Patient was notified of results.  

## 2014-10-04 NOTE — Telephone Encounter (Signed)
-----   Message from Van ClinesKaren M Aquino, MD sent at 10/04/2014  8:49 AM EDT ----- Pls let her know thyroid and B12 levels are normal, thanks

## 2014-10-09 DIAGNOSIS — E785 Hyperlipidemia, unspecified: Secondary | ICD-10-CM | POA: Insufficient documentation

## 2014-10-09 DIAGNOSIS — R413 Other amnesia: Secondary | ICD-10-CM | POA: Insufficient documentation

## 2014-10-12 ENCOUNTER — Ambulatory Visit (HOSPITAL_COMMUNITY)
Admission: RE | Admit: 2014-10-12 | Discharge: 2014-10-12 | Disposition: A | Discharge status: Home / Self Care | Payer: Medicare Other | Source: Ambulatory Visit | Attending: Neurology | Admitting: Neurology

## 2014-10-12 DIAGNOSIS — R413 Other amnesia: Secondary | ICD-10-CM | POA: Insufficient documentation

## 2014-10-12 DIAGNOSIS — F410 Panic disorder [episodic paroxysmal anxiety] without agoraphobia: Secondary | ICD-10-CM | POA: Insufficient documentation

## 2014-10-16 ENCOUNTER — Telehealth: Payer: Self-pay | Admitting: Family Medicine

## 2014-10-16 NOTE — Telephone Encounter (Signed)
Patient was notified of results.  

## 2014-10-16 NOTE — Telephone Encounter (Signed)
-----   Message from Van ClinesKaren M Aquino, MD sent at 10/15/2014  1:12 PM EDT ----- Pls let patient know I reviewed MRI brain, no evidence of tumor, stroke, or bleed. It shows age-related changes. Thanks

## 2014-10-30 ENCOUNTER — Ambulatory Visit (INDEPENDENT_AMBULATORY_CARE_PROVIDER_SITE_OTHER): Payer: Medicare Other | Admitting: Internal Medicine

## 2014-10-30 ENCOUNTER — Encounter: Payer: Self-pay | Admitting: Internal Medicine

## 2014-10-30 VITALS — BP 150/100 | Temp 98.3°F | Ht 64.75 in | Wt 141.6 lb

## 2014-10-30 DIAGNOSIS — R413 Other amnesia: Secondary | ICD-10-CM

## 2014-10-30 DIAGNOSIS — Z6379 Other stressful life events affecting family and household: Secondary | ICD-10-CM | POA: Diagnosis not present

## 2014-10-30 DIAGNOSIS — E782 Mixed hyperlipidemia: Secondary | ICD-10-CM

## 2014-10-30 DIAGNOSIS — R030 Elevated blood-pressure reading, without diagnosis of hypertension: Secondary | ICD-10-CM

## 2014-10-30 DIAGNOSIS — F4322 Adjustment disorder with anxiety: Secondary | ICD-10-CM

## 2014-10-30 NOTE — Progress Notes (Signed)
Pre visit review using our clinic review tool, if applicable. No additional management support is needed unless otherwise documented below in the visit note.  Chief Complaint  Patient presents with  . Anxiety  . Medication Management    HPI: Cynthia Floyd 74 y.o. comes in for fu after seeing neuro consult about poss memory issues  Felt to be  prob  from anxiety .  Some anxiety causing to dec fun of traveling.  Children  Getting on their case  With anxiety .  Asked about lovastatin  Husband has prostate cancer seed  Implantation.  Threw her for a loop   Now more attacks  . Even  Going to book club. Having to write everything down.   ROS: See pertinent positives and negatives per HPI. No syncope   Past Medical History  Diagnosis Date  . Generalized headaches     MRI head 2008 negative  008  . RLS (restless legs syndrome)   . Ovarian cyst     remote hx  . Anxiety   . GERD (gastroesophageal reflux disease)   . Hyperlipidemia     Family History  Problem Relation Age of Onset  . Pancreatic cancer Mother   . Diabetes Father   . Heart disease Father   . Hypertension Father   . Kidney disease Father   . Melanoma    . Kidney cancer Brother   . Colon cancer Neg Hx   . Stomach cancer Neg Hx     History   Social History  . Marital Status: Married    Spouse Name: N/A  . Number of Children: N/A  . Years of Education: N/A   Social History Main Topics  . Smoking status: Never Smoker   . Smokeless tobacco: Never Used  . Alcohol Use: 8.4 oz/week    14 Glasses of wine per week  . Drug Use: No  . Sexual Activity: Not on file   Other Topics Concern  . None   Social History Narrative   Retired   Married  h h of 2    Regular exercise-no   Cats   CB x 2    Husband dx prostate cancer  2015    Outpatient Encounter Prescriptions as of 10/30/2014  Medication Sig  . aspirin EC 325 MG tablet Take 325 mg by mouth every 4 (four) hours as needed.  . citalopram (CELEXA) 20  MG tablet Take 2 tablets (40 mg total) by mouth daily.  . clorazepate (TRANXENE) 7.5 MG tablet TAKE 1-2 TABLETS DAILY AS NEEDED  . lovastatin (MEVACOR) 20 MG tablet TAKE 1 TABLET EVERY DAY    EXAM:  BP 150/100 mmHg  Temp(Src) 98.3 F (36.8 C) (Oral)  Ht 5' 4.75" (1.645 m)  Wt 141 lb 9.6 oz (64.229 kg)  BMI 23.74 kg/m2  Body mass index is 23.74 kg/(m^2).  GENERAL: vitals reviewed and listed above, alert, oriented, appears well hydrated and in no acute distress anxieous as per baseline  Having great fear of not remembering instructions  HEENT: atraumatic, conjunctiva  clear, no obvious abnormalities on inspection of external nose and ears MS: moves all extremities without noticeable focal  abnormality PSYCH: pleasant and cooperative, no obvious depression   ASSESSMENT AND PLAN:  Discussed the following assessment and plan:  Adjustment disorder with anxious mood - ramping up baseline anxiety  hx of psyc in past pristiq no help advise counseling   Stress due to illness of family member - husband prostate cancer  Disturbance  of memory - felt to be from extreme anxiety fu neuro as planned neg eval   Mixed hyperlipidemia - dioubt if lova causing a problme but ok to stay off until wellness and lab monitoring in 2 months   Elevated blood pressure reading - reports in control at home  below 140/90 husband takes  readings Reviewed  Neuro consult  [name] seems more anxious and  Worried about instructions than usual .  wilreadress  meds at next visit   Has been on lexapro and pristque celexa seems to work best but seems to ramping up. Will aadress  The tranxeen as poss of adding to her memory concerns etc  Disc counsleing very important at this time 9 not a helpful experiecne about med eval and cost in the past) but willing to follow advice .  bp has been up and down  confrim normal at home    considier acei if needed -Patient advised to return or notify health care team  if symptoms worsen  ,persist or new concerns arise.in the interim Total visit > 50% spent counseling and coordinating care      Patient Instructions   Anxiety  Getting worse  Advise counseling and  Possible different medication management .   People in network .  BP  is up today and important to get that normal  Still concerned that need to add med for this.   BP Readings from Last 3 Encounters:  10/30/14 150/100  10/03/14 100/68  08/24/14 140/88   Take blood pressure readings twice a day for 10 - 14  days and then periodically .To ensure below 140/90   Send in readings     May be stress.   I dont think loivastatin has anything to do with your current situation.  But can stay off until your check up and getting labs   Lab Results  Component Value Date   WBC 7.5 11/15/2013   HGB 13.3 11/15/2013   HCT 39.2 11/15/2013   PLT 310.0 11/15/2013   GLUCOSE 113* 11/15/2013   CHOL 231* 11/15/2013   TRIG 66.0 11/15/2013   HDL 96.00 11/15/2013   LDLDIRECT 146.6 11/14/2012   LDLCALC 122* 11/15/2013   ALT 34 11/15/2013   AST 36 11/15/2013   NA 138 11/15/2013   K 4.0 11/15/2013   CL 103 11/15/2013   CREATININE 0.8 11/15/2013   BUN 20 11/15/2013   CO2 27 11/15/2013   TSH 1.229 10/03/2014   HGBA1C 5.8 11/15/2013         [name] M.D. IMPRESSION: This is a 74 year old left-handed woman with a history of hyperlipidemia, anxiety, presenting for evaluation of worsening memory, with one driving episode where she became extremely anxious. Her MMSE today is normal 30/30, history suggestive of possible mild cognitive impairment. We discussed different causes of memory loss. Check TSH and B12. MRI brain without contrast will be ordered to assess for underlying structural abnormality and assess vascular load. We discussed the effects of mood on memory, the big episode was likely more due to anxiety. She wondered about seeing a therapist, which I encouraged. We discussed the importance of control of  vascular risk factors, physical exercise, and brain stimulation exercises for brain health. She will follow-up in 6 months.   Thank you for allowing me to participate in the care of this patient. Please do not hesitate to call for any questions or concerns.   Patrcia Dolly, M.D. MRI brain IMPRESSION: 1. No acute intracranial  abnormality. 2. Chronic nonspecific white matter signal changes have mildly progressed since 2008, most commonly due to chronic small vessel disease.   Electronically Signed  By: Odessa FlemingH Hall M.D.  On: 10/12/2014 19:05

## 2014-10-30 NOTE — Patient Instructions (Addendum)
Anxiety  Getting worse  Advise counseling and  Possible different medication management .   People in network .  BP  is up today and important to get that normal  Still concerned that need to add med for this.   BP Readings from Last 3 Encounters:  10/30/14 150/100  10/03/14 100/68  08/24/14 140/88   Take blood pressure readings twice a day for 10 - 14  days and then periodically .To ensure below 140/90   Send in readings     May be stress.   I dont think loivastatin has anything to do with your current situation.  But can stay off until your check up and getting labs   Lab Results  Component Value Date   WBC 7.5 11/15/2013   HGB 13.3 11/15/2013   HCT 39.2 11/15/2013   PLT 310.0 11/15/2013   GLUCOSE 113* 11/15/2013   CHOL 231* 11/15/2013   TRIG 66.0 11/15/2013   HDL 96.00 11/15/2013   LDLDIRECT 146.6 11/14/2012   LDLCALC 122* 11/15/2013   ALT 34 11/15/2013   AST 36 11/15/2013   NA 138 11/15/2013   K 4.0 11/15/2013   CL 103 11/15/2013   CREATININE 0.8 11/15/2013   BUN 20 11/15/2013   CO2 27 11/15/2013   TSH 1.229 10/03/2014   HGBA1C 5.8 11/15/2013

## 2014-12-24 ENCOUNTER — Other Ambulatory Visit: Payer: Medicare Other

## 2014-12-31 ENCOUNTER — Encounter: Payer: Self-pay | Admitting: Internal Medicine

## 2014-12-31 ENCOUNTER — Ambulatory Visit (INDEPENDENT_AMBULATORY_CARE_PROVIDER_SITE_OTHER): Payer: Medicare Other | Admitting: Internal Medicine

## 2014-12-31 VITALS — BP 120/84 | Temp 98.2°F | Ht 64.5 in | Wt 141.2 lb

## 2014-12-31 DIAGNOSIS — E782 Mixed hyperlipidemia: Secondary | ICD-10-CM | POA: Diagnosis not present

## 2014-12-31 DIAGNOSIS — F4322 Adjustment disorder with anxiety: Secondary | ICD-10-CM

## 2014-12-31 DIAGNOSIS — Z6379 Other stressful life events affecting family and household: Secondary | ICD-10-CM

## 2014-12-31 DIAGNOSIS — Z79899 Other long term (current) drug therapy: Secondary | ICD-10-CM | POA: Diagnosis not present

## 2014-12-31 DIAGNOSIS — Z0000 Encounter for general adult medical examination without abnormal findings: Secondary | ICD-10-CM

## 2014-12-31 DIAGNOSIS — R7301 Impaired fasting glucose: Secondary | ICD-10-CM | POA: Diagnosis not present

## 2014-12-31 DIAGNOSIS — R413 Other amnesia: Secondary | ICD-10-CM | POA: Diagnosis not present

## 2014-12-31 LAB — CBC WITH DIFFERENTIAL/PLATELET
BASOS ABS: 0 10*3/uL (ref 0.0–0.1)
Basophils Relative: 0.6 % (ref 0.0–3.0)
EOS ABS: 0.3 10*3/uL (ref 0.0–0.7)
Eosinophils Relative: 4.6 % (ref 0.0–5.0)
HEMATOCRIT: 42 % (ref 36.0–46.0)
Hemoglobin: 13.9 g/dL (ref 12.0–15.0)
LYMPHS ABS: 1.4 10*3/uL (ref 0.7–4.0)
Lymphocytes Relative: 24.8 % (ref 12.0–46.0)
MCHC: 33 g/dL (ref 30.0–36.0)
MCV: 91.7 fl (ref 78.0–100.0)
MONO ABS: 0.5 10*3/uL (ref 0.1–1.0)
Monocytes Relative: 8.7 % (ref 3.0–12.0)
NEUTROS PCT: 61.3 % (ref 43.0–77.0)
Neutro Abs: 3.5 10*3/uL (ref 1.4–7.7)
Platelets: 315 10*3/uL (ref 150.0–400.0)
RBC: 4.58 Mil/uL (ref 3.87–5.11)
RDW: 13.9 % (ref 11.5–15.5)
WBC: 5.7 10*3/uL (ref 4.0–10.5)

## 2014-12-31 LAB — LIPID PANEL
CHOLESTEROL: 256 mg/dL — AB (ref 0–200)
HDL: 84.4 mg/dL (ref >39.00)
LDL Cholesterol: 154 mg/dL — ABNORMAL HIGH (ref 0–99)
NonHDL: 171.6
Total CHOL/HDL Ratio: 3
Triglycerides: 89 mg/dL (ref 0.0–149.0)
VLDL: 17.8 mg/dL (ref 0.0–40.0)

## 2014-12-31 LAB — HEPATIC FUNCTION PANEL
ALBUMIN: 4.3 g/dL (ref 3.5–5.2)
ALT: 23 U/L (ref 0–35)
AST: 29 U/L (ref 0–37)
Alkaline Phosphatase: 44 U/L (ref 39–117)
Bilirubin, Direct: 0.1 mg/dL (ref 0.0–0.3)
TOTAL PROTEIN: 6.8 g/dL (ref 6.0–8.3)
Total Bilirubin: 0.4 mg/dL (ref 0.2–1.2)

## 2014-12-31 LAB — BASIC METABOLIC PANEL
BUN: 17 mg/dL (ref 6–23)
CHLORIDE: 104 meq/L (ref 96–112)
CO2: 26 meq/L (ref 19–32)
Calcium: 9.4 mg/dL (ref 8.4–10.5)
Creatinine, Ser: 0.82 mg/dL (ref 0.40–1.20)
GFR: 72.48 mL/min (ref >60.00)
GLUCOSE: 103 mg/dL — AB (ref 70–99)
Potassium: 4.3 mEq/L (ref 3.5–5.1)
SODIUM: 139 meq/L (ref 135–145)

## 2014-12-31 LAB — HEMOGLOBIN A1C: HEMOGLOBIN A1C: 6 % (ref 4.6–6.5)

## 2014-12-31 NOTE — Progress Notes (Signed)
Pre visit review using our clinic review tool, if applicable. No additional management support is needed unless otherwise documented below in the visit note.  Chief Complaint  Patient presents with  . Medicare Wellness    work up for memory anxiety    HPI: Cynthia Floyd 74 y.o. comes in today for Preventive Medicare wellness visit . Chronic disease management. Memory and anxiety :Nl mri   Per neuro .Marland KitchenMarland KitchenHas appt  With her fu in sept  Anx: Not taking the tranxene daily   celexa helping but  Having a hard time with husbands  Prostate cancer dxrx with seed radiation and  Quick memory name retireval and   Family reaction to this. And very sensitive to this.  BP readings done by husband reported most below 140/80some 118 one was 150 systolic  Lipid med no se of med  Health Maintenance  Topic Date Due  . INFLUENZA VACCINE  02/18/2015  . MAMMOGRAM  01/03/2016  . COLONOSCOPY  09/06/2022  . TETANUS/TDAP  11/16/2023  . DEXA SCAN  Completed  . ZOSTAVAX  Completed  . PNA vac Low Risk Adult  Completed   Health Maintenance Review LIFESTYLE:  Exercise:  Walking gardening  Tobacco/ETS:no Alcohol: per day less than 1  Sugar beverages:no Sleep:some iterrupted  Drug use: no Bone density:  Colonoscopy: 2014 MEDICARE DOCUMENT QUESTIONS  TO SCAN   Hearing: ok  Vision:  No limitations at present . Last eye check UTD  Safety:  Has smoke detector and wears seat belts.  No firearms. No excess sun exposure. Sees dentist regularly.  Falls: no  Advance directive :  Reviewed  Has one.  Memory:  See above and neuro check  Depression: No anhedonia anxieous and cries some  Med helping    Nutrition: Eats well balanced diet; adequate calcium and vitamin D. No swallowing chewing problems.  Injury: no major injuries in the last six months.  Other healthcare providers:  Reviewed today .  Social:  Lives with spouse married.   Preventive parameters: up-to-date  Reviewed   ADLS:   There are  no problems or need for assistance  driving, feeding, obtaining food, dressing, toileting and bathing, managing money using phone. She is independent.   ROS:  GEN/ HEENT: No fever, significant weight changes sweats headaches vision problems hearing changes, CV/ PULM; No chest pain shortness of breath cough, syncope,edema  change in exercise tolerance. GI /GU: No adominal pain, vomiting, change in bowel habits. No blood in the stool. No significant GU symptoms. SKIN/HEME: ,no acute skin rashes suspicious lesions or bleeding. No lymphadenopathy, nodules, masses.  NEURO/ PSYCH:  No neurologic signs such as weakness numbness. No depression anxiety. IMM/ Allergy: No unusual infections.  Allergy .   REST of 12 system review negative except as per HPI   Past Medical History  Diagnosis Date  . Generalized headaches     MRI head 2008 negative  008  . RLS (restless legs syndrome)   . Ovarian cyst     remote hx  . Anxiety   . GERD (gastroesophageal reflux disease)   . Hyperlipidemia     Family History  Problem Relation Age of Onset  . Pancreatic cancer Mother   . Diabetes Father   . Heart disease Father   . Hypertension Father   . Kidney disease Father   . Melanoma    . Kidney cancer Brother   . Colon cancer Neg Hx   . Stomach cancer Neg Hx     History  Social History  . Marital Status: Married    Spouse Name: N/A  . Number of Children: N/A  . Years of Education: N/A   Social History Main Topics  . Smoking status: Never Smoker   . Smokeless tobacco: Never Used  . Alcohol Use: 8.4 oz/week    14 Glasses of wine per week  . Drug Use: No  . Sexual Activity: Not on file   Other Topics Concern  . None   Social History Narrative   Retired   Married  h h of 2    Regular exercise-no   Cats   CB x 2    Husband dx prostate cancer  2015    Outpatient Encounter Prescriptions as of 12/31/2014  Medication Sig  . aspirin EC 325 MG tablet Take 325 mg by mouth every 4 (four)  hours as needed.  . citalopram (CELEXA) 20 MG tablet Take 2 tablets (40 mg total) by mouth daily.  . clobetasol (TEMOVATE) 0.05 % external solution Apply 1 application to the scalp daily as needed for flares  . clorazepate (TRANXENE) 7.5 MG tablet TAKE 1-2 TABLETS DAILY AS NEEDED  . lovastatin (MEVACOR) 20 MG tablet TAKE 1 TABLET EVERY DAY   No facility-administered encounter medications on file as of 12/31/2014.    EXAM:  BP 120/84 mmHg  Temp(Src) 98.2 F (36.8 C) (Oral)  Ht 5' 4.5" (1.638 m)  Wt 141 lb 3.2 oz (64.048 kg)  BMI 23.87 kg/m2  Body mass index is 23.87 kg/(m^2).  Physical Exam: Vital signs reviewed AJO:INOM is a well-developed well-nourished alert cooperative   who appears stated age in no acute distress. Mild anxious  Looks down discussing husbands  Illness  HEENT: normocephalic atraumatic , Eyes: PERRL EOM's full, conjunctiva clear, Nares: paten,t no deformity discharge or tenderness., Ears: no deformity EAC's clear TMs with normal landmarks. Mouth: clear OP, no lesions, edema.  Moist mucous membranes. Dentition in adequate repair. NECK: supple without masses, thyromegaly or bruits. CHEST/PULM:  Clear to auscultation and percussion breath sounds equal no wheeze , rales or rhonchi. No chest wall deformities or tenderness. CV: PMI is nondisplaced, S1 S2 no gallops, murmurs, rubs. Peripheral pulses are full without delay.No JVD .  ABDOMEN: Bowel sounds normal nontender  No guard or rebound, no hepato splenomegal no CVA tenderness.   Extremtities:  No clubbing cyanosis or edema, no acute joint swelling or redness no focal atrophy some DJD changes  NEURO:  Oriented x3, cranial nerves 3-12 appear to be intact, no obvious focal weakness,gait within normal limits no abnormal reflexes or asymmetrical SKIN: No acute rashes normal turgor, color, no bruising or petechiae. PSYCH: Oriented, good eye contact,  See above , cognition and judgment appear normal. LN: no cervical axillary  inguinal adenopathy No noted deficits inattention, and speech.   Lab Results  Component Value Date   WBC 5.7 12/31/2014   HGB 13.9 12/31/2014   HCT 42.0 12/31/2014   PLT 315.0 12/31/2014   GLUCOSE 103* 12/31/2014   CHOL 256* 12/31/2014   TRIG 89.0 12/31/2014   HDL 84.40 12/31/2014   LDLDIRECT 146.6 11/14/2012   LDLCALC 154* 12/31/2014   ALT 23 12/31/2014   AST 29 12/31/2014   NA 139 12/31/2014   K 4.3 12/31/2014   CL 104 12/31/2014   CREATININE 0.82 12/31/2014   BUN 17 12/31/2014   CO2 26 12/31/2014   TSH 1.229 10/03/2014   HGBA1C 6.0 12/31/2014    ASSESSMENT AND PLAN:  Discussed the following assessment and plan:  Visit for preventive health examination - Plan: Basic metabolic panel, CBC with Differential/Platelet, Hemoglobin A1c, Hepatic function panel, Lipid panel  Adjustment disorder with anxious mood - Plan: Basic metabolic panel, CBC with Differential/Platelet, Hemoglobin A1c, Hepatic function panel, Lipid panel  Medication management - Plan: Basic metabolic panel, CBC with Differential/Platelet, Hemoglobin A1c, Hepatic function panel, Lipid panel  Medicare annual wellness visit, subsequent  Mixed hyperlipidemia - Plan: Basic metabolic panel, CBC with Differential/Platelet, Hemoglobin A1c, Hepatic function panel, Lipid panel  Disturbance of memory  Fasting hyperglycemia - Plan: Basic metabolic panel, CBC with Differential/Platelet, Hemoglobin A1c, Hepatic function panel, Lipid panel  Stress due to illness of family member BP Readings from Last 3 Encounters:  12/31/14 120/84  10/30/14 150/100  10/03/14 100/68  disc counseling  Other interventions  20 minutes in addition to the wellness information  Suggestions about counselor also given  Patient Care Team: Madelin Headings, MD as PCP - General Serena Colonel, MD Venancio Poisson, MD as Attending Physician (Dermatology) Elise Benne, MD (Ophthalmology) Serena Colonel, MD as Attending Physician (Otolaryngology) Van Clines, MD as Consulting Physician (Neurology)  Patient Instructions  Continue lifestyle intervention healthy eating and exercise . Keep fu with the neurologist .  Advise  See counselor  About  Adjusting .... To life changes . Illness in family and  Concerns as we discussed .  Yearly lab check  ROV in 3-4  months to see how you are doing  Continue good blood pressure control  Will notify you  of labs when available.     Neta Mends. Janylah Belgrave M.D.

## 2014-12-31 NOTE — Patient Instructions (Addendum)
Continue lifestyle intervention healthy eating and exercise . Keep fu with the neurologist .  Advise  See counselor  About  Adjusting .... To life changes . Illness in family and  Concerns as we discussed .  Yearly lab check  ROV in 3-4  months to see how you are doing  Continue good blood pressure control  Will notify you  of labs when available.

## 2015-01-13 ENCOUNTER — Other Ambulatory Visit: Payer: Self-pay | Admitting: Internal Medicine

## 2015-01-14 NOTE — Telephone Encounter (Signed)
Sent to the pharmacy by e-scribe. 

## 2015-02-21 ENCOUNTER — Other Ambulatory Visit: Payer: Self-pay | Admitting: Internal Medicine

## 2015-02-21 NOTE — Telephone Encounter (Signed)
Called to the pharmacy and left on machine. 

## 2015-03-19 ENCOUNTER — Other Ambulatory Visit: Payer: Self-pay | Admitting: Internal Medicine

## 2015-03-20 NOTE — Telephone Encounter (Signed)
Pt did not return for a follow up visit.  Please advise.  Thanks!

## 2015-04-04 ENCOUNTER — Telehealth: Payer: Self-pay | Admitting: Neurology

## 2015-04-04 NOTE — Telephone Encounter (Signed)
Lmovm to return my call. 

## 2015-04-04 NOTE — Telephone Encounter (Signed)
Pt's daughter Darl Pikes called back and needs to speak to Dr Karel Jarvis before her appointment at 11:00/Dawn CB# [phone]

## 2015-04-04 NOTE — Telephone Encounter (Signed)
Pt's daughter Darl Pikes called and would like to speak with Dr Karel Jarvis about some things that she should be aware of before Pt's appointment tomorrow/Dawn CB# [phone]

## 2015-04-05 ENCOUNTER — Encounter: Payer: Self-pay | Admitting: Neurology

## 2015-04-05 ENCOUNTER — Ambulatory Visit (INDEPENDENT_AMBULATORY_CARE_PROVIDER_SITE_OTHER): Payer: Medicare Other | Admitting: Neurology

## 2015-04-05 VITALS — BP 128/74 | HR 70 | Resp 16 | Ht 65.0 in | Wt 140.0 lb

## 2015-04-05 DIAGNOSIS — F411 Generalized anxiety disorder: Secondary | ICD-10-CM

## 2015-04-05 DIAGNOSIS — G3184 Mild cognitive impairment, so stated: Secondary | ICD-10-CM

## 2015-04-05 NOTE — Patient Instructions (Addendum)
1. Recommend Behavioral Medicine for psychotherapy 2. Anxiety can worsen memory, discuss this with family 3. Follow-up in 4-5 months
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
## 2015-04-05 NOTE — Telephone Encounter (Signed)
She states that her and her brother are concerned that the medications she is on is interacting with her memory. Mainly they are concerned about the anxiety medication, she also states she drinks 1-3 glasses of wine a night. With this she thinks the alcohol and the mixture of meds are causing issues for her in way of confusion and memory. States memory seems to be better in the morning. She states that they have to do a lot of repeating with her within conversations.

## 2015-04-08 DIAGNOSIS — G3184 Mild cognitive impairment, so stated: Secondary | ICD-10-CM | POA: Insufficient documentation

## 2015-04-08 DIAGNOSIS — F411 Generalized anxiety disorder: Secondary | ICD-10-CM | POA: Insufficient documentation

## 2015-04-16 ENCOUNTER — Ambulatory Visit (INDEPENDENT_AMBULATORY_CARE_PROVIDER_SITE_OTHER): Payer: Medicare Other | Admitting: Internal Medicine

## 2015-04-16 ENCOUNTER — Encounter: Payer: Self-pay | Admitting: Internal Medicine

## 2015-04-16 VITALS — BP 146/82 | HR 73 | Temp 98.5°F | Resp 16 | Ht 65.0 in | Wt 137.9 lb

## 2015-04-16 DIAGNOSIS — Z23 Encounter for immunization: Secondary | ICD-10-CM

## 2015-04-16 DIAGNOSIS — R413 Other amnesia: Secondary | ICD-10-CM | POA: Diagnosis not present

## 2015-04-16 DIAGNOSIS — F4322 Adjustment disorder with anxiety: Secondary | ICD-10-CM

## 2015-04-16 DIAGNOSIS — F411 Generalized anxiety disorder: Secondary | ICD-10-CM | POA: Diagnosis not present

## 2015-04-16 NOTE — Progress Notes (Signed)
Chief Complaint  Patient presents with  . Follow-up  . Anxiety    HPI: Cynthia Floyd 74 y.o. comes in for follow-up in regard to her memory and anxiety.  BP  Seems to be all right.  Usually at goal below 145 or so. Anxiety  Citalopram    Now  Taking  Tranxene  a bit more regular Until husbands surgery.   He has anxiety . He states their lives have changed since his diagnosis of prostate cancer getting treated with radiation seeds. Memory about the same neurologist evaluated felt she should have psychotherapy before adding medication. Cynthia Floyd has not made an appointment she was aware a gave her information and she did get a name from the neurologist but hasn't acted on it. There is been many stresses. ROS: See pertinent positives and negatives per HPI.  Past Medical History  Diagnosis Date  . Generalized headaches     MRI head 2008 negative  008  . RLS (restless legs syndrome)   . Ovarian cyst     remote hx  . Anxiety   . GERD (gastroesophageal reflux disease)   . Hyperlipidemia     Family History  Problem Relation Age of Onset  . Pancreatic cancer Mother   . Diabetes Father   . Heart disease Father   . Hypertension Father   . Kidney disease Father   . Melanoma    . Kidney cancer Brother   . Colon cancer Neg Hx   . Stomach cancer Neg Hx     Social History   Social History  . Marital Status: Married    Spouse Name: N/A  . Number of Children: N/A  . Years of Education: N/A   Social History Main Topics  . Smoking status: Never Smoker   . Smokeless tobacco: Never Used  . Alcohol Use: 8.4 oz/week    14 Glasses of wine per week  . Drug Use: No  . Sexual Activity: Not Asked   Other Topics Concern  . None   Social History Narrative   Retired   Married  h h of 2    Regular exercise-no   Cats   CB x 2    Husband dx prostate cancer  2015    Outpatient Prescriptions Prior to Visit  Medication Sig Dispense Refill  . aspirin EC 325 MG tablet Take 325 mg by  mouth daily.     . citalopram (CELEXA) 20 MG tablet TAKE 2 TABLETS BY MOUTH EVERY DAY 180 tablet 3  . clobetasol (TEMOVATE) 0.05 % external solution Apply 1 application to the scalp daily as needed for flares    . clorazepate (TRANXENE) 7.5 MG tablet TAKE 1 TO 2 TABLETS BY MOUTH EVERY DAY AS NEEDED 90 tablet 0  . lovastatin (MEVACOR) 20 MG tablet TAKE 1 TABLET BY MOUTH EVERY DAY 90 tablet 1   No facility-administered medications prior to visit.     EXAM:  BP 146/82 mmHg  Pulse 73  Temp(Src) 98.5 F (36.9 C) (Oral)  Resp 16  Ht  (1.651 m)  Wt 137 lb 14.4 oz (62.551 kg)  BMI 22.95 kg/m2  SpO2 96%  Body mass index is 22.95 kg/(m^2).  GENERAL: vitals reviewed and listed above, alert, oriented, appears well hydrated and in no acute distress HEENT: atraumatic, conjunctiva  clear, no obvious abnormalities on inspection of external nose and ears OP : no lesion edema or exudate  NECK: no obvious masses on inspection palpation  LUNGS: clear to  auscultation bilaterally, no wheezes, rales or rhonchi, good air movement CV: HRRR, no clubbing cyanosis or  peripheral edema nl cap refill  MS: moves all extremities without noticeable focal  abnormality PSYCH: pleasant and cooperative, no obvious depression or anxiety BP Readings from Last 3 Encounters:  04/16/15 146/82  04/05/15 128/74  12/31/14 120/84   Wt Readings from Last 3 Encounters:  04/16/15 137 lb 14.4 oz (62.551 kg)  04/05/15 140 lb (63.504 kg)  12/31/14 141 lb 3.2 oz (64.048 kg)   Lab Results  Component Value Date   WBC 5.7 12/31/2014   HGB 13.9 12/31/2014   HCT 42.0 12/31/2014   PLT 315.0 12/31/2014   GLUCOSE 103* 12/31/2014   CHOL 256* 12/31/2014   TRIG 89.0 12/31/2014   HDL 84.40 12/31/2014   LDLDIRECT 146.6 11/14/2012   LDLCALC 154* 12/31/2014   ALT 23 12/31/2014   AST 29 12/31/2014   NA 139 12/31/2014   K 4.3 12/31/2014   CL 104 12/31/2014   CREATININE 0.82 12/31/2014   BUN 17 12/31/2014   CO2 26  12/31/2014   TSH 1.229 10/03/2014   HGBA1C 6.0 12/31/2014    ASSESSMENT AND PLAN:  Discussed the following assessment and plan:  Anxiety state  Flu vaccine need - Plan: Flu Vaccine QUAD 36+ mos IM  Adjustment disorder with anxious mood  Disturbance of memory - Neuro evaluation negative significant MRI changes. See text. Caution to not use the Tranxene on a regular basis risk more than benefit. She needs to get him with a counselor therapist try to help direct what would be helpful. Phone numbers given she can go to the counselor the neurologist advised her other. Counseling today. She does seem to have decreased memory with anxiety her baseline was ADD like with anxiety and now things are worse. She mourns how her husband has lost weight and "isn't the same person "" Total visit > 50% spent counseling and coordinating care as indicated in above note and in instructions to patient .   -Patient advised to return or notify health care team  if symptoms worsen ,persist or new concerns arise.  Patient Instructions  You need to get appt with therapist for anxiety . You need to call and make the appt.  I don't advise taking tranxene on a regular basis .  This can cause brain fog  Although can help acute anxiety .  Please call and make appt    For therapist .  If your bp is above  150 we should treat this also.   Flu shot today .      Neta Mends. Panosh M.D.

## 2015-04-16 NOTE — Patient Instructions (Signed)
You need to get appt with therapist for anxiety . You need to call and make the appt.  I don't advise taking tranxene on a regular basis .  This can cause brain fog  Although can help acute anxiety .  Please call and make appt    For therapist .  If your bp is above  150 we should treat this also.   Flu shot today .

## 2015-04-17 ENCOUNTER — Other Ambulatory Visit: Payer: Self-pay | Admitting: Internal Medicine

## 2015-04-17 NOTE — Telephone Encounter (Signed)
An refill x 1

## 2015-04-23 ENCOUNTER — Telehealth: Payer: Self-pay | Admitting: Neurology

## 2015-04-23 DIAGNOSIS — F419 Anxiety disorder, unspecified: Secondary | ICD-10-CM

## 2015-04-23 NOTE — Telephone Encounter (Signed)
PT called and said that Dr Karel Jarvis had recommended that she get some counseling and she wanted to know the name Dr Karel Jarvis suggested/Dawn CB# [phone]

## 2015-04-23 NOTE — Telephone Encounter (Signed)
Pt called to request info for a counseling section appt/ call back @ [phone]

## 2015-04-24 NOTE — Telephone Encounter (Signed)
Returned patient's call. She states that Dr. Karel Jarvis had recommended for her to try therapy. She states she is now ready to give that a try. I explained to her that we would put in the referral and the behavioral health office will call her to set up an appt.

## 2015-04-29 ENCOUNTER — Telehealth: Payer: Self-pay | Admitting: Family Medicine

## 2015-04-29 NOTE — Telephone Encounter (Signed)
Pt left a message on my machine.  Having dizziness.  Please call the pt at [phone] and transfer her to triage.  Thanks!

## 2015-04-29 NOTE — Telephone Encounter (Signed)
Patient says that her symptoms have resolved. Feels as though it was related to medication withdrawal. Does not want to talk with Triage.

## 2015-05-03 ENCOUNTER — Telehealth: Payer: Self-pay | Admitting: Internal Medicine

## 2015-05-03 NOTE — Telephone Encounter (Signed)
Pt has been in touch with [facility].  No further services needed.

## 2015-05-03 NOTE — Telephone Encounter (Signed)
[facility] [place] [facility] advised the patient that we cannot access ant info or [facility]. Patient was upset and states that she feels [facility] has blocked her calls and will not return any messages she leaves for them on the main line. [name] to discuss.  It does appear via her chart that patient may have a 10/19 appt scheduled with [name]. I will check with [facility] and confirm with you.

## 2015-05-08 ENCOUNTER — Ambulatory Visit (INDEPENDENT_AMBULATORY_CARE_PROVIDER_SITE_OTHER): Payer: 59 | Admitting: Psychology

## 2015-05-08 DIAGNOSIS — F4322 Adjustment disorder with anxiety: Secondary | ICD-10-CM

## 2015-05-16 ENCOUNTER — Ambulatory Visit (INDEPENDENT_AMBULATORY_CARE_PROVIDER_SITE_OTHER): Payer: 59 | Admitting: Psychology

## 2015-05-16 DIAGNOSIS — F4322 Adjustment disorder with anxiety: Secondary | ICD-10-CM | POA: Diagnosis not present

## 2015-05-23 ENCOUNTER — Ambulatory Visit (INDEPENDENT_AMBULATORY_CARE_PROVIDER_SITE_OTHER): Payer: 59 | Admitting: Psychology

## 2015-05-23 DIAGNOSIS — F4322 Adjustment disorder with anxiety: Secondary | ICD-10-CM

## 2015-06-03 ENCOUNTER — Ambulatory Visit (INDEPENDENT_AMBULATORY_CARE_PROVIDER_SITE_OTHER): Payer: 59 | Admitting: Psychology

## 2015-06-03 DIAGNOSIS — F4322 Adjustment disorder with anxiety: Secondary | ICD-10-CM

## 2015-06-11 ENCOUNTER — Ambulatory Visit (INDEPENDENT_AMBULATORY_CARE_PROVIDER_SITE_OTHER): Payer: 59 | Admitting: Psychology

## 2015-06-11 DIAGNOSIS — F4322 Adjustment disorder with anxiety: Secondary | ICD-10-CM

## 2015-06-24 ENCOUNTER — Ambulatory Visit (INDEPENDENT_AMBULATORY_CARE_PROVIDER_SITE_OTHER): Payer: 59 | Admitting: Psychology

## 2015-06-24 DIAGNOSIS — F4322 Adjustment disorder with anxiety: Secondary | ICD-10-CM

## 2015-08-06 ENCOUNTER — Ambulatory Visit: Payer: Medicare Other | Admitting: Neurology

## 2015-08-17 ENCOUNTER — Other Ambulatory Visit: Payer: Self-pay | Admitting: Internal Medicine

## 2015-08-19 NOTE — Telephone Encounter (Signed)
Sent to the pharmacy by e-scribe.  Pt has upcoming appt on 01/13/16.

## 2015-08-20 ENCOUNTER — Ambulatory Visit (INDEPENDENT_AMBULATORY_CARE_PROVIDER_SITE_OTHER): Payer: Medicare Other | Admitting: Internal Medicine

## 2015-08-20 ENCOUNTER — Encounter: Payer: Self-pay | Admitting: Internal Medicine

## 2015-08-20 VITALS — BP 170/88 | Temp 98.7°F | Ht 65.0 in | Wt 135.3 lb

## 2015-08-20 DIAGNOSIS — F411 Generalized anxiety disorder: Secondary | ICD-10-CM | POA: Diagnosis not present

## 2015-08-20 DIAGNOSIS — R030 Elevated blood-pressure reading, without diagnosis of hypertension: Secondary | ICD-10-CM | POA: Diagnosis not present

## 2015-08-20 DIAGNOSIS — R413 Other amnesia: Secondary | ICD-10-CM | POA: Diagnosis not present

## 2015-08-20 DIAGNOSIS — I10 Essential (primary) hypertension: Secondary | ICD-10-CM

## 2015-08-20 DIAGNOSIS — IMO0001 Reserved for inherently not codable concepts without codable children: Secondary | ICD-10-CM

## 2015-08-20 MED ORDER — LISINOPRIL 10 MG PO TABS: 10.0000 mg | ORAL_TABLET | Freq: Every day | ORAL | Status: DC

## 2015-08-20 NOTE — Progress Notes (Signed)
Pre visit review using our clinic review tool, if applicable. No additional management support is needed unless otherwise documented below in the visit note.   Chief Complaint  Patient presents with  . Follow-up    HPI: Cynthia Floyd 75 y.o.  comes in for chronic disease/ medication management  hsa been seeing dr Jason Fila for anxiety   But also has had memory decline    Last visit  dr Campbell Riches before x mas .     Has fu appt nuero not until April but  Sent in by dr bray cause of concerns  That memeory issues not all from  Anxiety.  Interview today with patient and then later with husband. She thinks her anxiety might be getting slightly worse especially when driving and she thinks she is getting lost because her visual cues changed over time and then panics. She tends to panic when she can't remember something right away. No other change in health. Thinks that her blood pressure has been okay but hasn't been checked recently tends to go up in the office.  Husband thinks that her memory is very good in some areas although sometimes in short-term memory she forgets. Since things are spotty. ROS: See pertinent positives and negatives per HPI. No chest pain shortness of breath syncope major change in hearing and vision  Past Medical History  Diagnosis Date  . Generalized headaches     MRI head 2008 negative  008  . RLS (restless legs syndrome)   . Ovarian cyst     remote hx  . Anxiety   . GERD (gastroesophageal reflux disease)   . Hyperlipidemia     Family History  Problem Relation Age of Onset  . Pancreatic cancer Mother   . Diabetes Father   . Heart disease Father   . Hypertension Father   . Kidney disease Father   . Melanoma    . Kidney cancer Brother   . Colon cancer Neg Hx   . Stomach cancer Neg Hx     Social History   Social History  . Marital Status: Married    Spouse Name: N/A  . Number of Children: N/A  . Years of Education: N/A   Social History Main  Topics  . Smoking status: Never Smoker   . Smokeless tobacco: Never Used  . Alcohol Use: 8.4 oz/week    14 Glasses of wine per week  . Drug Use: No  . Sexual Activity: Not Asked   Other Topics Concern  . None   Social History Narrative   Retired   Married  h h of 2    Regular exercise-no   Cats   CB x 2    Husband dx prostate cancer  2015    Outpatient Prescriptions Prior to Visit  Medication Sig Dispense Refill  . aspirin EC 325 MG tablet Take 325 mg by mouth daily.     . citalopram (CELEXA) 20 MG tablet TAKE 2 TABLETS BY MOUTH EVERY DAY 180 tablet 3  . clorazepate (TRANXENE) 7.5 MG tablet TAKE 1-2 TABLETS BY MOUTH AS NEEDED 90 tablet 1  . lovastatin (MEVACOR) 20 MG tablet TAKE 1 TABLET BY MOUTH EVERY DAY 90 tablet 1  . clobetasol (TEMOVATE) 0.05 % external solution Apply 1 application to the scalp daily as needed for flares     No facility-administered medications prior to visit.     EXAM:  BP 170/88 mmHg  Temp(Src) 98.7 F (37.1 C) (Oral)  Ht   (1.651 m)  Wt 135 lb 4.8 oz (61.372 kg)  BMI 22.52 kg/m2  Body mass index is 22.52 kg/(m^2). Repeat blood pressure in the 160 range bilaterally GENERAL: vitals reviewed and listed above, alert, oriented, appears well hydrated and in no acute distress HEENT: atraumatic, conjunctiva  clear, no obvious abnormalities on inspection of external nose and ears MS: moves all extremities without noticeable focal  abnormality PSYCH: pleasant and cooperative, no obvious depression minimally anxious she does have increased motor activity. No focal abnormalities. Lab Results  Component Value Date   WBC 5.7 12/31/2014   HGB 13.9 12/31/2014   HCT 42.0 12/31/2014   PLT 315.0 12/31/2014   GLUCOSE 103* 12/31/2014   CHOL 256* 12/31/2014   TRIG 89.0 12/31/2014   HDL 84.40 12/31/2014   LDLDIRECT 146.6 11/14/2012   LDLCALC 154* 12/31/2014   ALT 23 12/31/2014   AST 29 12/31/2014   NA 139 12/31/2014   K 4.3 12/31/2014   CL 104  12/31/2014   CREATININE 0.82 12/31/2014   BUN 17 12/31/2014   CO2 26 12/31/2014   TSH 1.229 10/03/2014   HGBA1C 6.0 12/31/2014     ASSESSMENT AND PLAN:  Discussed the following assessment and plan:  Anxiety state  Elevated BP  Disturbance of memory  Essential hypertension Quite elevated blood pressure today despite no obvious acute anxiety. She probably has white coat changes but her baseline may indeed be creeping up. Discussed with her the importance especially with new or data to control her baseline of pressure for past cardiovascular neuro outcomes. Husband will check her readings for about 10-14 days send them into [place] and unless very good control below 140/90 begin lisinopril daily. We'll follow-up in about 2 months after seeing neurologist follow-up. Discussed possibility of neurocognitive testing. I agree with the psychologist that there seems to be more than anxiety in the mix that could be causing her memory lapses. I have known Sandy over a number of years and she appears to be a bit more diffuse than in past. 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 However now she appears to be functioning pretty well. ( She probably has baseline ADHD or learning disability as noted in the remote past.) -Patient advised to return or notify health care team  if symptoms worsen ,persist or new concerns arise.  Patient Instructions  I would like you to consider  starting  medication for  blood pressure elevation . even though anxiety can make it go up.  Want your bp below 140/90 and preferably below 130/90 Even though anxiety can be part of this it seems too high to be the whole story . Want you to f/u with neurology    Consideration for further testing for  ...  To assess the cognition. Memory issue.  And how much anxiety is the issue.   Please check  bp readings   Twice a day for 10 - 14 days    Send  them in, drop them by and  Can make decision about BP medicine.      Neta Mends. Linnea Todisco M.D.

## 2015-08-20 NOTE — Patient Instructions (Addendum)
I would like you to consider  starting  medication for  blood pressure elevation . even though anxiety can make it go up.  Want your bp below 140/90 and preferably below 130/90 Even though anxiety can be part of this it seems too high to be the whole story . Want you to f/u with neurology    Consideration for further testing for  ...  To assess the cognition. Memory issue.  And how much anxiety is the issue.   Please check  bp readings   Twice a day for 10 - 14 days    Send them in, drop them by and  Can make decision about BP medicine.

## 2015-10-15 ENCOUNTER — Encounter: Payer: Self-pay | Admitting: Internal Medicine

## 2015-10-15 ENCOUNTER — Ambulatory Visit (INDEPENDENT_AMBULATORY_CARE_PROVIDER_SITE_OTHER): Payer: Medicare Other | Admitting: Internal Medicine

## 2015-10-15 VITALS — BP 130/90 | HR 78 | Temp 98.3°F | Wt 136.0 lb

## 2015-10-15 DIAGNOSIS — R413 Other amnesia: Secondary | ICD-10-CM

## 2015-10-15 DIAGNOSIS — F4322 Adjustment disorder with anxiety: Secondary | ICD-10-CM

## 2015-10-15 DIAGNOSIS — Z79899 Other long term (current) drug therapy: Secondary | ICD-10-CM

## 2015-10-15 DIAGNOSIS — I10 Essential (primary) hypertension: Secondary | ICD-10-CM | POA: Diagnosis not present

## 2015-10-15 DIAGNOSIS — F411 Generalized anxiety disorder: Secondary | ICD-10-CM

## 2015-10-15 LAB — BASIC METABOLIC PANEL
BUN: 16 mg/dL (ref 6–23)
CALCIUM: 9.6 mg/dL (ref 8.4–10.5)
CO2: 29 mEq/L (ref 19–32)
Chloride: 103 mEq/L (ref 96–112)
Creatinine, Ser: 0.81 mg/dL (ref 0.40–1.20)
GFR: 73.36 mL/min (ref >60.00)
GLUCOSE: 87 mg/dL (ref 70–99)
Potassium: 4.6 mEq/L (ref 3.5–5.1)
Sodium: 140 mEq/L (ref 135–145)

## 2015-10-15 LAB — TSH: TSH: 1.05 u[IU]/mL (ref 0.35–4.50)

## 2015-10-15 NOTE — Progress Notes (Signed)
Pre visit review using our clinic review tool, if applicable. No additional management support is needed unless otherwise documented below in the visit note. 
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
## 2015-10-15 NOTE — Patient Instructions (Addendum)
Your blood pressure is better today . Continue same medication. Will notify you  of labs when available.  Try taking  At celexa at  night .   ( 2- 20 mg )  And see if less woozy .   We can consider   Changing this  Medication    Can be a rocky road at times.   But I agree avoiding taking   The  tranxene  At this time since not on it for a while   And does have risk  call if needed ,  Will notify you  of labs when available.  ROV in    2-3

## 2015-10-25 ENCOUNTER — Ambulatory Visit: Payer: Self-pay | Admitting: Neurology

## 2015-10-29 ENCOUNTER — Encounter: Payer: Self-pay | Admitting: Neurology

## 2015-10-29 ENCOUNTER — Ambulatory Visit (INDEPENDENT_AMBULATORY_CARE_PROVIDER_SITE_OTHER): Payer: Medicare Other | Admitting: Neurology

## 2015-10-29 VITALS — BP 108/66 | HR 84 | Resp 18 | Wt 135.0 lb

## 2015-10-29 DIAGNOSIS — F411 Generalized anxiety disorder: Secondary | ICD-10-CM | POA: Diagnosis not present

## 2015-10-29 DIAGNOSIS — G3184 Mild cognitive impairment, so stated: Secondary | ICD-10-CM

## 2015-10-29 NOTE — Patient Instructions (Signed)
1. Discuss anxiety with Dr. Fabian SharpPanosh 2. Physical exercise and brain stimulation exercises (crossword puzzles, word search, etc) are important for brain health 3. Follow-up in 6 months
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
## 2015-11-27 ENCOUNTER — Other Ambulatory Visit: Payer: Self-pay | Admitting: Internal Medicine

## 2015-11-27 NOTE — Telephone Encounter (Signed)
Ok

## 2015-11-27 NOTE — Telephone Encounter (Signed)
#  90 sent to the pharmacy (pt gets 90 day supply).  Has wellness on 01/13/16

## 2015-12-13 ENCOUNTER — Telehealth: Payer: Self-pay | Admitting: Internal Medicine

## 2015-12-13 NOTE — Telephone Encounter (Addendum)
Pt states her daughter is coming to see her the weeks of her cpe and labs. She has not seen her daughter in a long time, [place] have been in [place]. [place] are [place] [facility] [place]. Pt would like to know any way you could let her do her cpe on 6/16. They are a few slots open that day.  They rest of that week looks fairly full.

## 2015-12-13 NOTE — Telephone Encounter (Signed)
Ok  But can also move it forward into July

## 2015-12-13 NOTE — Telephone Encounter (Signed)
Pt has been scheduled.  °

## 2015-12-14 ENCOUNTER — Other Ambulatory Visit: Payer: Self-pay | Admitting: Internal Medicine

## 2015-12-17 ENCOUNTER — Other Ambulatory Visit: Payer: Self-pay | Admitting: General Practice

## 2015-12-17 MED ORDER — LISINOPRIL 10 MG PO TABS: 10.0000 mg | ORAL_TABLET | Freq: Every day | ORAL | Status: DC

## 2015-12-18 ENCOUNTER — Other Ambulatory Visit: Payer: Self-pay | Admitting: Internal Medicine

## 2015-12-31 ENCOUNTER — Other Ambulatory Visit (INDEPENDENT_AMBULATORY_CARE_PROVIDER_SITE_OTHER): Payer: Medicare Other

## 2015-12-31 DIAGNOSIS — Z0000 Encounter for general adult medical examination without abnormal findings: Secondary | ICD-10-CM

## 2015-12-31 LAB — HEPATIC FUNCTION PANEL
ALBUMIN: 4.3 g/dL (ref 3.5–5.2)
ALT: 21 U/L (ref 0–35)
AST: 20 U/L (ref 0–37)
Alkaline Phosphatase: 31 U/L — ABNORMAL LOW (ref 39–117)
BILIRUBIN DIRECT: 0.1 mg/dL (ref 0.0–0.3)
TOTAL PROTEIN: 6.6 g/dL (ref 6.0–8.3)
Total Bilirubin: 0.5 mg/dL (ref 0.2–1.2)

## 2015-12-31 LAB — CBC WITH DIFFERENTIAL/PLATELET
BASOS PCT: 0.8 % (ref 0.0–3.0)
Basophils Absolute: 0 10*3/uL (ref 0.0–0.1)
EOS ABS: 0.2 10*3/uL (ref 0.0–0.7)
Eosinophils Relative: 3.8 % (ref 0.0–5.0)
HEMATOCRIT: 38.8 % (ref 36.0–46.0)
Hemoglobin: 12.8 g/dL (ref 12.0–15.0)
LYMPHS ABS: 1.2 10*3/uL (ref 0.7–4.0)
LYMPHS PCT: 20.1 % (ref 12.0–46.0)
MCHC: 33 g/dL (ref 30.0–36.0)
MCV: 92 fl (ref 78.0–100.0)
Monocytes Absolute: 0.6 10*3/uL (ref 0.1–1.0)
Monocytes Relative: 9.9 % (ref 3.0–12.0)
NEUTROS ABS: 3.9 10*3/uL (ref 1.4–7.7)
NEUTROS PCT: 65.4 % (ref 43.0–77.0)
PLATELETS: 306 10*3/uL (ref 150.0–400.0)
RBC: 4.22 Mil/uL (ref 3.87–5.11)
RDW: 13.5 % (ref 11.5–15.5)
WBC: 6 10*3/uL (ref 4.0–10.5)

## 2015-12-31 LAB — BASIC METABOLIC PANEL
BUN: 17 mg/dL (ref 6–23)
CHLORIDE: 106 meq/L (ref 96–112)
CO2: 26 meq/L (ref 19–32)
CREATININE: 0.76 mg/dL (ref 0.40–1.20)
Calcium: 9.1 mg/dL (ref 8.4–10.5)
GFR: 78.91 mL/min (ref >60.00)
GLUCOSE: 104 mg/dL — AB (ref 70–99)
Potassium: 3.9 mEq/L (ref 3.5–5.1)
Sodium: 141 mEq/L (ref 135–145)

## 2015-12-31 LAB — LIPID PANEL
CHOLESTEROL: 222 mg/dL — AB (ref 0–200)
HDL: 83.4 mg/dL (ref >39.00)
LDL Cholesterol: 116 mg/dL — ABNORMAL HIGH (ref 0–99)
NONHDL: 138.42
Total CHOL/HDL Ratio: 3
Triglycerides: 110 mg/dL (ref 0.0–149.0)
VLDL: 22 mg/dL (ref 0.0–40.0)

## 2015-12-31 LAB — TSH: TSH: 0.58 u[IU]/mL (ref 0.35–4.50)

## 2016-01-03 ENCOUNTER — Encounter: Payer: Self-pay | Admitting: Internal Medicine

## 2016-01-03 ENCOUNTER — Ambulatory Visit (INDEPENDENT_AMBULATORY_CARE_PROVIDER_SITE_OTHER): Payer: Medicare Other | Admitting: Internal Medicine

## 2016-01-03 VITALS — BP 144/80 | Temp 98.8°F | Ht 64.0 in | Wt 136.9 lb

## 2016-01-03 DIAGNOSIS — Z0000 Encounter for general adult medical examination without abnormal findings: Secondary | ICD-10-CM

## 2016-01-03 DIAGNOSIS — I10 Essential (primary) hypertension: Secondary | ICD-10-CM | POA: Diagnosis not present

## 2016-01-03 DIAGNOSIS — R413 Other amnesia: Secondary | ICD-10-CM | POA: Diagnosis not present

## 2016-01-03 DIAGNOSIS — Z79899 Other long term (current) drug therapy: Secondary | ICD-10-CM

## 2016-01-03 DIAGNOSIS — R4189 Other symptoms and signs involving cognitive functions and awareness: Secondary | ICD-10-CM

## 2016-01-03 DIAGNOSIS — F411 Generalized anxiety disorder: Secondary | ICD-10-CM

## 2016-01-03 DIAGNOSIS — E782 Mixed hyperlipidemia: Secondary | ICD-10-CM

## 2016-01-03 NOTE — Progress Notes (Signed)
Pre visit review using our clinic review tool, if applicable. No additional management support is needed unless otherwise documented below in the visit note.  Chief Complaint  Patient presents with  . Medicare Wellness    HPI: Cynthia Floyd 75 y.o. comes in today for Preventive Medicare wellness visit .Since last visit. Here with husband today for part of a history.  She has seen the neurologist about 2 months ago about her cognitive decline felt to be stable and she declined neuropsychological testing. Feels that she still had anxiety issues. Since then she thinks her memory has gotten a bit worse although her husband doesn't think so. She is still taking Celexa only rarely takes Tranxene once or twice a month at the most.  Uses GPS in car  Risk of getting lost .   She takes blood pressure medicine and cholesterol medicine most recently. Hard to tell how often she misses it isn't using a pillbox for all her medicines.  Is physically active walking and in the garden but not a routine.  Health Maintenance  Topic Date Due  . MAMMOGRAM  01/03/2016  . INFLUENZA VACCINE  02/18/2016  . COLONOSCOPY  09/06/2022  . TETANUS/TDAP  11/16/2023  . DEXA SCAN  Completed  . ZOSTAVAX  Completed  . PNA vac Low Risk Adult  Completed   Health Maintenance Review LIFESTYLE:  T no AD 1-2  Per day  Sugar beverages:  Sleep: good   MEDICARE DOCUMENT QUESTIONS  TO SCAN   Hearing: ok   Vision:  No limitations at present . Last eye check UTD glasses   Safety:  Has smoke detector and wears seat belts.  No firearms. No excess sun exposure. Sees dentist regularly.  Falls:  No   Advance directive :  Reviewed  Doesn't have one given ho with disc   Memory: see above .  Depression: No anhedonia unusual crying or depressive symptoms  Nutrition: Eats well balanced diet; adequate calcium and vitamin D. No swallowing chewing problems.  Injury: no major injuries in the last six months.  Other  healthcare providers:  Reviewed today .  Social:  Lives with spouse married. No pets.   Preventive parameters: up-to-date  Reviewed   ADLS:   There are no problems or need for assistance  driving, feeding, obtaining food, dressing, toileting and bathing, managing money using phone. She is independent.    ROS:  GEN/ HEENT: No fever, significant weight changes sweats headaches vision problems hearing changes, CV/ PULM; No chest pain shortness of breath cough, syncope,edema  change in exercise tolerance. GI /GU: No adominal pain, vomiting, change in bowel habits. No blood in the stool. No significant GU symptoms. SKIN/HEME: ,no acute skin rashes suspicious lesions or bleeding. No lymphadenopathy, nodules, masses.  NEURO/ PSYCH:  No neurologic signs such as weakness numbness. No depression  IMM/ Allergy: No unusual infections.  Allergy .   REST of 12 system review negative except as per HPI   Past Medical History  Diagnosis Date  . Generalized headaches     MRI head 2008 negative  008  . RLS (restless legs syndrome)   . Ovarian cyst     remote hx  . Anxiety   . GERD (gastroesophageal reflux disease)   . Hyperlipidemia     Family History  Problem Relation Age of Onset  . Pancreatic cancer Mother   . Diabetes Father   . Heart disease Father   . Hypertension Father   . Kidney disease Father   .  Melanoma    . Kidney cancer Brother   . Colon cancer Neg Hx   . Stomach cancer Neg Hx     Social History   Social History  . Marital Status: Married    Spouse Name: N/A  . Number of Children: N/A  . Years of Education: N/A   Social History Main Topics  . Smoking status: Never Smoker   . Smokeless tobacco: Never Used  . Alcohol Use: 8.4 oz/week    14 Glasses of wine per week  . Drug Use: No  . Sexual Activity: Not Asked   Other Topics Concern  . None   Social History Narrative   Retired   Married  h h of 2    Regular exercise-no   Cats   CB x 2    Husband dx  prostate cancer  2015    Outpatient Encounter Prescriptions as of 01/03/2016  Medication Sig  . aspirin EC 325 MG tablet Take 325 mg by mouth daily.   . citalopram (CELEXA) 20 MG tablet TAKE 2 TABLETS BY MOUTH EVERY DAY  . clorazepate (TRANXENE) 7.5 MG tablet TAKE 1-2 TABLETS BY MOUTH AS NEEDED  . lisinopril (PRINIVIL,ZESTRIL) 10 MG tablet TAKE 1 TABLET BY MOUTH EVERY DAY  . lovastatin (MEVACOR) 20 MG tablet TAKE 1 TABLET BY MOUTH EVERY DAY   No facility-administered encounter medications on file as of 01/03/2016.    EXAM:  BP 144/80 mmHg  Temp(Src) 98.8 F (37.1 C) (Oral)  Ht 5' 4"  (1.626 m)  Wt 136 lb 14.4 oz (62.097 kg)  BMI 23.49 kg/m2  Body mass index is 23.49 kg/(m^2).  Physical Exam: Vital signs reviewed RKY:HCWC is a well-developed well-nourished alert cooperative   who appears stated age in no acute distress.  HEENT: normocephalic atraumatic , Eyes: PERRL EOM's full, conjunctiva clear, Nares: paten,t no deformity discharge or tenderness., Ears: no deformity EAC's clear TMs with normal landmarks. Mouth: clear OP, no lesions, edema.  Moist mucous membranes. Dentition in adequate repair. NECK: supple without masses, thyromegaly or bruits. CHEST/PULM:  Clear to auscultation and percussion breath sounds equal no wheeze , rales or rhonchi. No chest wall deformities or tenderness. CV: PMI is nondisplaced, S1 S2 no gallops, murmurs, rubs. Peripheral pulses are full without delay.No JVD . Breast: normal by inspection . No dimpling, discharge, masses, tenderness or discharge . ABDOMEN: Bowel sounds normal nontender  No guard or rebound, no hepato splenomegal no CVA tenderness.   Extremtities:  No clubbing cyanosis or edema, no acute joint swelling or redness no focal atrophy NEURO:  Oriented x3, cranial nerves 3-12 appear to be intact, no obvious focal weakness,gait within normal limits no abnormal reflexes or asymmetrical SKIN: No acute rashes normal turgor, color, no bruising or  petechiae. PSYCH: Oriented, good eye contact, no obvious depression anxiety, cognition and judgment appear normal. LN: no cervical axillary inguinal adenopathy No noted deficits in memory, attention, and speech.   Lab Results  Component Value Date   WBC 6.0 12/31/2015   HGB 12.8 12/31/2015   HCT 38.8 12/31/2015   PLT 306.0 12/31/2015   GLUCOSE 104* 12/31/2015   CHOL 222* 12/31/2015   TRIG 110.0 12/31/2015   HDL 83.40 12/31/2015   LDLDIRECT 146.6 11/14/2012   LDLCALC 116* 12/31/2015   ALT 21 12/31/2015   AST 20 12/31/2015   NA 141 12/31/2015   K 3.9 12/31/2015   CL 106 12/31/2015   CREATININE 0.76 12/31/2015   BUN 17 12/31/2015   CO2 26 12/31/2015  TSH 0.58 12/31/2015   HGBA1C 6.0 12/31/2014   Labs reveiwed  ASSESSMENT AND PLAN:  Discussed the following assessment and plan:  Visit for preventive health examination  Medicare annual wellness visit, subsequent  Essential hypertension  Anxiety state  Medication management  Disturbance of memory  Mixed hyperlipidemia  Cognitive decline  We spent a good deal of time talking about the concerns about memory. Is frustrating for her. Discussed options if anxiety is predominant would have her see Dr. Loralee Pacas geriatric psychiatrist although I am suspecting that memory is not all from anxiety. Encouraged her to follow-up regularly with neurologist to see if things are changing by assessment. Plan to have her follow-up after appointment with Dr. Delice Lesch in October. Question if medications are appropriate if can continuing to decline. Reported to her again that I rather her not take the Tranxene unless actually necessary. Blood pressure goal is below 140/90 which it has been at other visits. Counseled. Additional  Related to problems as above.  Patient Care Team: Burnis Medin, MD as PCP - General Izora Gala, MD Rolm Bookbinder, MD as Attending Physician (Dermatology) Sharyne Peach, MD (Ophthalmology) Izora Gala, MD as  Attending Physician (Otolaryngology) Cameron Sprang, MD as Consulting Physician (Neurology)  Patient Instructions  Rare use of the  tranxene    As this can sometimes clog the memory an can be assosciated with continued decline .  Your anxiety can effect you memory but memory problems  Make people more anxious .  I advice consider  seeing  Psychiatric for medication consult.   Alternatively the neuro  psych testing is a good idea to see  If other interventions would help.  Cholesterol is  Better  Make sure we control bp readings   aerobic exercise and  Good sleep  Can prevent help .  Progression of  memory problems .    continue follow up with  Dr Delice Lesch   If alarming changes  Then see her earlier.    Health Maintenance, Female Adopting a healthy lifestyle and getting preventive care can go a long way to promote health and wellness. Talk with your health care provider about what schedule of regular examinations is right for you. This is a good chance for you to check in with your provider about disease prevention and staying healthy. In between checkups, there are plenty of things you can do on your own. Experts have done a lot of research about which lifestyle changes and preventive measures are most likely to keep you healthy. Ask your health care provider for more information. WEIGHT AND DIET  Eat a healthy diet  Be sure to include plenty of vegetables, fruits, low-fat dairy products, and lean protein.  Do not eat a lot of foods high in solid fats, added sugars, or salt.  Get regular exercise. This is one of the most important things you can do for your health.  Most adults should exercise for at least 150 minutes each week. The exercise should increase your heart rate and make you sweat (moderate-intensity exercise).  Most adults should also do strengthening exercises at least twice a week. This is in addition to the moderate-intensity exercise.  Maintain a healthy weight  Body  mass index (BMI) is a measurement that can be used to identify possible weight problems. It estimates body fat based on height and weight. Your health care provider can help determine your BMI and help you achieve or maintain a healthy weight.  For females 20 years of  age and older:   A BMI below 18.5 is considered underweight.  A BMI of 18.5 to 24.9 is normal.  A BMI of 25 to 29.9 is considered overweight.  A BMI of 30 and above is considered obese.  Watch levels of cholesterol and blood lipids  You should start having your blood tested for lipids and cholesterol at 75 years of age, then have this test every 5 years.  You may need to have your cholesterol levels checked more often if:  Your lipid or cholesterol levels are high.  You are older than 75 years of age.  You are at high risk for heart disease.  CANCER SCREENING   Lung Cancer  Lung cancer screening is recommended for adults 39-29 years old who are at high risk for lung cancer because of a history of smoking.  A yearly low-dose CT scan of the lungs is recommended for people who:  Currently smoke.  Have quit within the past 15 years.  Have at least a 30-pack-year history of smoking. A pack year is smoking an average of one pack of cigarettes a day for 1 year.  Yearly screening should continue until it has been 15 years since you quit.  Yearly screening should stop if you develop a health problem that would prevent you from having lung cancer treatment.  Breast Cancer  Practice breast self-awareness. This means understanding how your breasts normally appear and feel.  It also means doing regular breast self-exams. Let your health care provider know about any changes, no matter how small.  If you are in your 20s or 30s, you should have a clinical breast exam (CBE) by a health care provider every 1-3 years as part of a regular health exam.  If you are 99 or older, have a CBE every year. Also consider having a  breast X-ray (mammogram) every year.  If you have a family history of breast cancer, talk to your health care provider about genetic screening.  If you are at high risk for breast cancer, talk to your health care provider about having an MRI and a mammogram every year.  Breast cancer gene (BRCA) assessment is recommended for women who have family members with BRCA-related cancers. BRCA-related cancers include:  Breast.  Ovarian.  Tubal.  Peritoneal cancers.  Results of the assessment will determine the need for genetic counseling and BRCA1 and BRCA2 testing. Cervical Cancer Your health care provider may recommend that you be screened regularly for cancer of the pelvic organs (ovaries, uterus, and vagina). This screening involves a pelvic examination, including checking for microscopic changes to the surface of your cervix (Pap test). You may be encouraged to have this screening done every 3 years, beginning at age 48.  For women ages 33-65, health care providers may recommend pelvic exams and Pap testing every 3 years, or they may recommend the Pap and pelvic exam, combined with testing for human papilloma virus (HPV), every 5 years. Some types of HPV increase your risk of cervical cancer. Testing for HPV may also be done on women of any age with unclear Pap test results.  Other health care providers may not recommend any screening for nonpregnant women who are considered low risk for pelvic cancer and who do not have symptoms. Ask your health care provider if a screening pelvic exam is right for you.  If you have had past treatment for cervical cancer or a condition that could lead to cancer, you need Pap tests and screening for  cancer for at least 20 years after your treatment. If Pap tests have been discontinued, your risk factors (such as having a new sexual partner) need to be reassessed to determine if screening should resume. Some women have medical problems that increase the chance of  getting cervical cancer. In these cases, your health care provider may recommend more frequent screening and Pap tests. Colorectal Cancer  This type of cancer can be detected and often prevented.  Routine colorectal cancer screening usually begins at 75 years of age and continues through 75 years of age.  Your health care provider may recommend screening at an earlier age if you have risk factors for colon cancer.  Your health care provider may also recommend using home test kits to check for hidden blood in the stool.  A small camera at the end of a tube can be used to examine your colon directly (sigmoidoscopy or colonoscopy). This is done to check for the earliest forms of colorectal cancer.  Routine screening usually begins at age 97.  Direct examination of the colon should be repeated every 5-10 years through 75 years of age. However, you may need to be screened more often if early forms of precancerous polyps or small growths are found. Skin Cancer  Check your skin from head to toe regularly.  Tell your health care provider about any new moles or changes in moles, especially if there is a change in a mole's shape or color.  Also tell your health care provider if you have a mole that is larger than the size of a pencil eraser.  Always use sunscreen. Apply sunscreen liberally and repeatedly throughout the day.  Protect yourself by wearing long sleeves, pants, a wide-brimmed hat, and sunglasses whenever you are outside. HEART DISEASE, DIABETES, AND HIGH BLOOD PRESSURE   High blood pressure causes heart disease and increases the risk of stroke. High blood pressure is more likely to develop in:  People who have blood pressure in the high end of the normal range (130-139/85-89 mm Hg).  People who are overweight or obese.  People who are African American.  If you are 31-88 years of age, have your blood pressure checked every 3-5 years. If you are 25 years of age or older, have  your blood pressure checked every year. You should have your blood pressure measured twice--once when you are at a hospital or clinic, and once when you are not at a hospital or clinic. Record the average of the two measurements. To check your blood pressure when you are not at a hospital or clinic, you can use:  An automated blood pressure machine at a pharmacy.  A home blood pressure monitor.  If you are between 45 years and 62 years old, ask your health care provider if you should take aspirin to prevent strokes.  Have regular diabetes screenings. This involves taking a blood sample to check your fasting blood sugar level.  If you are at a normal weight and have a low risk for diabetes, have this test once every three years after 75 years of age.  If you are overweight and have a high risk for diabetes, consider being tested at a younger age or more often. PREVENTING INFECTION  Hepatitis B  If you have a higher risk for hepatitis B, you should be screened for this virus. You are considered at high risk for hepatitis B if:  You were born in a country where hepatitis B is common. Ask your health care  provider which countries are considered high risk.  Your parents were born in a high-risk country, and you have not been immunized against hepatitis B (hepatitis B vaccine).  You have HIV or AIDS.  You use needles to inject street drugs.  You live with someone who has hepatitis B.  You have had sex with someone who has hepatitis B.  You get hemodialysis treatment.  You take certain medicines for conditions, including cancer, organ transplantation, and autoimmune conditions. Hepatitis C  Blood testing is recommended for:  Everyone born from [birth year] through 1965.  Anyone with known risk factors for hepatitis C. Sexually transmitted infections (STIs)  You should be screened for sexually transmitted infections (STIs) including gonorrhea and chlamydia if:  You are sexually active and  are younger than 75 years of age.  You are older than 75 years of age and your health care provider tells you that you are at risk for this type of infection.  Your sexual activity has changed since you were last screened and you are at an increased risk for chlamydia or gonorrhea. Ask your health care provider if you are at risk.  If you do not have HIV, but are at risk, it may be recommended that you take a prescription medicine daily to prevent HIV infection. This is called pre-exposure prophylaxis (PrEP). You are considered at risk if:  You are sexually active and do not regularly use condoms or know the HIV status of your partner(s).  You take drugs by injection.  You are sexually active with a partner who has HIV. Talk with your health care provider about whether you are at high risk of being infected with HIV. If you choose to begin PrEP, you should first be tested for HIV. You should then be tested every 3 months for as long as you are taking PrEP.  PREGNANCY   If you are premenopausal and you may become pregnant, ask your health care provider about preconception counseling.  If you may become pregnant, take 400 to 800 micrograms (mcg) of folic acid every day.  If you want to prevent pregnancy, talk to your health care provider about birth control (contraception). OSTEOPOROSIS AND MENOPAUSE   Osteoporosis is a disease in which the bones lose minerals and strength with aging. This can result in serious bone fractures. Your risk for osteoporosis can be identified using a bone density scan.  If you are 14 years of age or older, or if you are at risk for osteoporosis and fractures, ask your health care provider if you should be screened.  Ask your health care provider whether you should take a calcium or vitamin D supplement to lower your risk for osteoporosis.  Menopause may have certain physical symptoms and risks.  Hormone replacement therapy may reduce some of these symptoms  and risks. Talk to your health care provider about whether hormone replacement therapy is right for you.  HOME CARE INSTRUCTIONS   Schedule regular health, dental, and eye exams.  Stay current with your immunizations.   Do not use any tobacco products including cigarettes, chewing tobacco, or electronic cigarettes.  If you are pregnant, do not drink alcohol.  If you are breastfeeding, limit how much and how often you drink alcohol.  Limit alcohol intake to no more than 1 drink per day for nonpregnant women. One drink equals 12 ounces of beer, 5 ounces of wine, or 1 ounces of hard liquor.  Do not use street drugs.  Do not share needles.  Ask your health care provider for help if you need support or information about quitting drugs.  Tell your health care provider if you often feel depressed.  Tell your health care provider if you have ever been abused or do not feel safe at home.   This information is not intended to replace advice given to you by your health care provider. Make sure you discuss any questions you have with your health care provider.   Document Released: 01/19/2011 Document Revised: 07/27/2014 Document Reviewed: 06/07/2013 Elsevier Interactive Patient Education 2016 [place] K. Lyall Faciane M.D.  IMPRESSION: This is a 75 yo LH woman with a history of hyperlipidemia, anxiety, who presented with worsening memory. 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 MMSE today 25/30 (24/30 in September 2016), again indicating mild cognitive impairment. She is again noted to be anxious today, and reported her anxiety level is higher. She reports she is not seeing her therapist anymore. There was concern from her therapist that memory changes may not be all due to anxiety. We discussed doing a Neuropsychological evaluation, however she and her husband both state that anxiety is a bigger issue now, particularly with the  sensation she feels over her chest when nervous. She would like to discuss anxiety treatment first with Dr. Regis Bill and will consider Neuropsychological evaluation on her next visit in 6 months.   Thank you for allowing me to participate in her care. Please do not hesitate to call for any questions or concerns.  The duration of this appointment visit was 25 minutes of face-to-face time with the patient. Greater than 50% of this time was spent in counseling, explanation of diagnosis, planning of further management, and coordination of care.   Ellouise Newer, M.D.   CC: Dr. Regis Bill

## 2016-01-03 NOTE — Patient Instructions (Addendum)
Rare use of the  tranxene    As this can sometimes clog the memory an can be assosciated with continued decline .  Your anxiety can effect you memory but memory problems  Make people more anxious .  I advice consider  seeing  Psychiatric for medication consult.   Alternatively the neuro  psych testing is a good idea to see  If other interventions would help.  Cholesterol is  Better  Make sure we control bp readings   aerobic exercise and  Good sleep  Can prevent help .  Progression of  memory problems .    continue follow up with  Dr Delice Lesch   If alarming changes  Then see her earlier.    Health Maintenance, Female Adopting a healthy lifestyle and getting preventive care can go a long way to promote health and wellness. Talk with your health care provider about what schedule of regular examinations is right for you. This is a good chance for you to check in with your provider about disease prevention and staying healthy. In between checkups, there are plenty of things you can do on your own. Experts have done a lot of research about which lifestyle changes and preventive measures are most likely to keep you healthy. Ask your health care provider for more information. WEIGHT AND DIET  Eat a healthy diet  Be sure to include plenty of vegetables, fruits, low-fat dairy products, and lean protein.  Do not eat a lot of foods high in solid fats, added sugars, or salt.  Get regular exercise. This is one of the most important things you can do for your health.  Most adults should exercise for at least 150 minutes each week. The exercise should increase your heart rate and make you sweat (moderate-intensity exercise).  Most adults should also do strengthening exercises at least twice a week. This is in addition to the moderate-intensity exercise.  Maintain a healthy weight  Body mass index (BMI) is a measurement that can be used to identify possible weight problems. It estimates body fat based on  height and weight. Your health care provider can help determine your BMI and help you achieve or maintain a healthy weight.  For females 49 years of age and older:   A BMI below 18.5 is considered underweight.  A BMI of 18.5 to 24.9 is normal.  A BMI of 25 to 29.9 is considered overweight.  A BMI of 30 and above is considered obese.  Watch levels of cholesterol and blood lipids  You should start having your blood tested for lipids and cholesterol at 75 years of age, then have this test every 5 years.  You may need to have your cholesterol levels checked more often if:  Your lipid or cholesterol levels are high.  You are older than 75 years of age.  You are at high risk for heart disease.  CANCER SCREENING   Lung Cancer  Lung cancer screening is recommended for adults 63-45 years old who are at high risk for lung cancer because of a history of smoking.  A yearly low-dose CT scan of the lungs is recommended for people who:  Currently smoke.  Have quit within the past 15 years.  Have at least a 30-pack-year history of smoking. A pack year is smoking an average of one pack of cigarettes a day for 1 year.  Yearly screening should continue until it has been 15 years since you quit.  Yearly screening should stop if you  develop a health problem that would prevent you from having lung cancer treatment.  Breast Cancer  Practice breast self-awareness. This means understanding how your breasts normally appear and feel.  It also means doing regular breast self-exams. Let your health care provider know about any changes, no matter how small.  If you are in your 20s or 30s, you should have a clinical breast exam (CBE) by a health care provider every 1-3 years as part of a regular health exam.  If you are 35 or older, have a CBE every year. Also consider having a breast X-ray (mammogram) every year.  If you have a family history of breast cancer, talk to your health care  provider about genetic screening.  If you are at high risk for breast cancer, talk to your health care provider about having an MRI and a mammogram every year.  Breast cancer gene (BRCA) assessment is recommended for women who have family members with BRCA-related cancers. BRCA-related cancers include:  Breast.  Ovarian.  Tubal.  Peritoneal cancers.  Results of the assessment will determine the need for genetic counseling and BRCA1 and BRCA2 testing. Cervical Cancer Your health care provider may recommend that you be screened regularly for cancer of the pelvic organs (ovaries, uterus, and vagina). This screening involves a pelvic examination, including checking for microscopic changes to the surface of your cervix (Pap test). You may be encouraged to have this screening done every 3 years, beginning at age 52.  For women ages 73-65, health care providers may recommend pelvic exams and Pap testing every 3 years, or they may recommend the Pap and pelvic exam, combined with testing for human papilloma virus (HPV), every 5 years. Some types of HPV increase your risk of cervical cancer. Testing for HPV may also be done on women of any age with unclear Pap test results.  Other health care providers may not recommend any screening for nonpregnant women who are considered low risk for pelvic cancer and who do not have symptoms. Ask your health care provider if a screening pelvic exam is right for you.  If you have had past treatment for cervical cancer or a condition that could lead to cancer, you need Pap tests and screening for cancer for at least 20 years after your treatment. If Pap tests have been discontinued, your risk factors (such as having a new sexual partner) need to be reassessed to determine if screening should resume. Some women have medical problems that increase the chance of getting cervical cancer. In these cases, your health care provider may recommend more frequent screening and  Pap tests. Colorectal Cancer  This type of cancer can be detected and often prevented.  Routine colorectal cancer screening usually begins at 75 years of age and continues through 75 years of age.  Your health care provider may recommend screening at an earlier age if you have risk factors for colon cancer.  Your health care provider may also recommend using home test kits to check for hidden blood in the stool.  A small camera at the end of a tube can be used to examine your colon directly (sigmoidoscopy or colonoscopy). This is done to check for the earliest forms of colorectal cancer.  Routine screening usually begins at age 64.  Direct examination of the colon should be repeated every 5-10 years through 75 years of age. However, you may need to be screened more often if early forms of precancerous polyps or small growths are found. Skin Cancer  Check your skin from head to toe regularly.  Tell your health care provider about any new moles or changes in moles, especially if there is a change in a mole's shape or color.  Also tell your health care provider if you have a mole that is larger than the size of a pencil eraser.  Always use sunscreen. Apply sunscreen liberally and repeatedly throughout the day.  Protect yourself by wearing long sleeves, pants, a wide-brimmed hat, and sunglasses whenever you are outside. HEART DISEASE, DIABETES, AND HIGH BLOOD PRESSURE   High blood pressure causes heart disease and increases the risk of stroke. High blood pressure is more likely to develop in:  People who have blood pressure in the high end of the normal range (130-139/85-89 mm Hg).  People who are overweight or obese.  People who are African American.  If you are 74-55 years of age, have your blood pressure checked every 3-5 years. If you are 6 years of age or older, have your blood pressure checked every year. You should have your blood pressure measured twice--once when you are at  a hospital or clinic, and once when you are not at a hospital or clinic. Record the average of the two measurements. To check your blood pressure when you are not at a hospital or clinic, you can use:  An automated blood pressure machine at a pharmacy.  A home blood pressure monitor.  If you are between 2 years and 39 years old, ask your health care provider if you should take aspirin to prevent strokes.  Have regular diabetes screenings. This involves taking a blood sample to check your fasting blood sugar level.  If you are at a normal weight and have a low risk for diabetes, have this test once every three years after 74 years of age.  If you are overweight and have a high risk for diabetes, consider being tested at a younger age or more often. PREVENTING INFECTION  Hepatitis B  If you have a higher risk for hepatitis B, you should be screened for this virus. You are considered at high risk for hepatitis B if:  You were born in a country where hepatitis B is common. Ask your health care provider which countries are considered high risk.  Your parents were born in a high-risk country, and you have not been immunized against hepatitis B (hepatitis B vaccine).  You have HIV or AIDS.  You use needles to inject street drugs.  You live with someone who has hepatitis B.  You have had sex with someone who has hepatitis B.  You get hemodialysis treatment.  You take certain medicines for conditions, including cancer, organ transplantation, and autoimmune conditions. Hepatitis C  Blood testing is recommended for:  Everyone born from [birth year] through 1965.  Anyone with known risk factors for hepatitis C. Sexually transmitted infections (STIs)  You should be screened for sexually transmitted infections (STIs) including gonorrhea and chlamydia if:  You are sexually active and are younger than 75 years of age.  You are older than 75 years of age and your health care provider tells you  that you are at risk for this type of infection.  Your sexual activity has changed since you were last screened and you are at an increased risk for chlamydia or gonorrhea. Ask your health care provider if you are at risk.  If you do not have HIV, but are at risk, it may be recommended that you take a prescription medicine  daily to prevent HIV infection. This is called pre-exposure prophylaxis (PrEP). You are considered at risk if:  You are sexually active and do not regularly use condoms or know the HIV status of your partner(s).  You take drugs by injection.  You are sexually active with a partner who has HIV. Talk with your health care provider about whether you are at high risk of being infected with HIV. If you choose to begin PrEP, you should first be tested for HIV. You should then be tested every 3 months for as long as you are taking PrEP.  PREGNANCY   If you are premenopausal and you may become pregnant, ask your health care provider about preconception counseling.  If you may become pregnant, take 400 to 800 micrograms (mcg) of folic acid every day.  If you want to prevent pregnancy, talk to your health care provider about birth control (contraception). OSTEOPOROSIS AND MENOPAUSE   Osteoporosis is a disease in which the bones lose minerals and strength with aging. This can result in serious bone fractures. Your risk for osteoporosis can be identified using a bone density scan.  If you are 77 years of age or older, or if you are at risk for osteoporosis and fractures, ask your health care provider if you should be screened.  Ask your health care provider whether you should take a calcium or vitamin D supplement to lower your risk for osteoporosis.  Menopause may have certain physical symptoms and risks.  Hormone replacement therapy may reduce some of these symptoms and risks. Talk to your health care provider about whether hormone replacement therapy is right for you.  HOME  CARE INSTRUCTIONS   Schedule regular health, dental, and eye exams.  Stay current with your immunizations.   Do not use any tobacco products including cigarettes, chewing tobacco, or electronic cigarettes.  If you are pregnant, do not drink alcohol.  If you are breastfeeding, limit how much and how often you drink alcohol.  Limit alcohol intake to no more than 1 drink per day for nonpregnant women. One drink equals 12 ounces of beer, 5 ounces of wine, or 1 ounces of hard liquor.  Do not use street drugs.  Do not share needles.  Ask your health care provider for help if you need support or information about quitting drugs.  Tell your health care provider if you often feel depressed.  Tell your health care provider if you have ever been abused or do not feel safe at home.   This information is not intended to replace advice given to you by your health care provider. Make sure you discuss any questions you have with your health care provider.   Document Released: 01/19/2011 Document Revised: 07/27/2014 Document Reviewed: 06/07/2013 Elsevier Interactive Patient Education [employer].

## 2016-01-09 ENCOUNTER — Other Ambulatory Visit: Payer: Self-pay

## 2016-01-13 ENCOUNTER — Encounter: Payer: Self-pay | Admitting: Internal Medicine

## 2016-02-03 ENCOUNTER — Telehealth: Payer: Self-pay | Admitting: Internal Medicine

## 2016-02-03 NOTE — Telephone Encounter (Signed)
Pts daughter would like to call to discuss pt.  Daughter:  Raul DelSusan Perlman   [phone]) [phone].

## 2016-02-03 NOTE — Telephone Encounter (Signed)
Pt's daughter would like to know what is really going on with the patient. Pt's daughter says that she has read some of the office visit notes that she's seen laying around the house. She said that patient is not seeing the therapist that you had recommended either. She will be making the appt for the patient to see the Dr that you had recommended. Pt's daughter stated that she knows her mom will not go unless she makes the appt, so the daughter plans to make the appt. She did have questions about the anti-depressant because she did hear that it was a dangerous medication/dosage for her mom to be on. She also doesn't believe that her mother is taking the medications like she should be. Please advise, daughter's phone number is [phone].

## 2016-02-05 NOTE — Telephone Encounter (Signed)
Spoke to [name] (daughter) and informed her of all.  She agrees and will come to future appointments.

## 2016-02-05 NOTE — Telephone Encounter (Signed)
I would only know what her mom tells me. Glad she has communication with her mom  And she should continue to discuss  With her mom her concerns   And give help to implement any advice .    Would be  Optimum   if a family member  Can  come in to a visit(s) with her moms permission  to discuss  And adequately;y communicate  all the concerns  .   I agree that  Monitoring medication is very important and avoiding risky medication  Use.  As confusion can occur. (Advised to extremely  limit or not use the tranxene .)  Also   I  advised  a geriatric psychiatrist such as Dr Donell BeersPlovsky  and  Follow up with her  neurology  to  Help with this

## 2016-02-24 ENCOUNTER — Ambulatory Visit (INDEPENDENT_AMBULATORY_CARE_PROVIDER_SITE_OTHER): Payer: Medicare Other | Admitting: Internal Medicine

## 2016-02-24 ENCOUNTER — Encounter: Payer: Self-pay | Admitting: Internal Medicine

## 2016-02-24 VITALS — BP 136/82 | Temp 98.2°F | Ht 64.0 in | Wt 133.8 lb

## 2016-02-24 DIAGNOSIS — R413 Other amnesia: Secondary | ICD-10-CM | POA: Diagnosis not present

## 2016-02-24 DIAGNOSIS — R197 Diarrhea, unspecified: Secondary | ICD-10-CM

## 2016-02-24 DIAGNOSIS — R4189 Other symptoms and signs involving cognitive functions and awareness: Secondary | ICD-10-CM | POA: Diagnosis not present

## 2016-02-24 DIAGNOSIS — G3184 Mild cognitive impairment, so stated: Secondary | ICD-10-CM

## 2016-02-24 NOTE — Progress Notes (Signed)
Chief Complaint  Patient presents with  . Memory Loss  . Diarrhea    HPI: Cynthia Floyd 75 y.o. comesin for sda a[[t   Scheduled as diarrhea  But  Has memory dx as previously determined   See past notes and concern from family   An patient.  She has had onset of diarrhea frequent gthe first few days  A few weeks ago without fever vomiting or blood somewhat watery now loose  some form but not normal . Still happening in the day without blood or cramps of weight loss. doesent seem to be related to what she eats   Wants to tell me that she thinks memory getting worse and very concerned   jhsuaband doesn't think that bad and feels   No change.  She states when conversing  Her mind goes blank and cant think  Or reetrieve information .    This is newer for her. No getting lost recently not driving much . No falling   ? What she can do . ?  Not taking tranxene  ROS: See pertinent positives and negatives per HPI. No cough syncoep  Past Medical History:  Diagnosis Date  . Anxiety   . Generalized headaches    MRI head 2008 negative  008  . GERD (gastroesophageal reflux disease)   . Hyperlipidemia   . Ovarian cyst    remote hx  . RLS (restless legs syndrome)     Family History  Problem Relation Age of Onset  . Pancreatic cancer Mother   . Diabetes Father   . Heart disease Father   . Hypertension Father   . Kidney disease Father   . Melanoma    . Kidney cancer Brother   . Colon cancer Neg Hx   . Stomach cancer Neg Hx     Social History   Social History  . Marital status: Married    Spouse name: N/A  . Number of children: N/A  . Years of education: N/A   Social History Main Topics  . Smoking status: Never Smoker  . Smokeless tobacco: Never Used  . Alcohol use 8.4 oz/week    14 Glasses of wine per week  . Drug use: No  . Sexual activity: Not Asked   Other Topics Concern  . None   Social History Narrative   Retired   Married  h h of 2    Regular exercise-no   Cats    CB x 2    Husband dx prostate cancer  2015    Outpatient Medications Prior to Visit  Medication Sig Dispense Refill  . aspirin EC 325 MG tablet Take 325 mg by mouth daily.     . citalopram (CELEXA) 20 MG tablet TAKE 2 TABLETS BY MOUTH EVERY DAY 180 tablet 3  . lisinopril (PRINIVIL,ZESTRIL) 10 MG tablet TAKE 1 TABLET BY MOUTH EVERY DAY 90 tablet 0  . lovastatin (MEVACOR) 20 MG tablet TAKE 1 TABLET BY MOUTH EVERY DAY 90 tablet 1  . clorazepate (TRANXENE) 7.5 MG tablet TAKE 1-2 TABLETS BY MOUTH AS NEEDED (Patient not taking: Reported on 02/24/2016) 90 tablet 0   No facility-administered medications prior to visit.      EXAM:  BP 136/82 (BP Location: Right Arm, Patient Position: Sitting, Cuff Size: Normal)   Temp 98.2 F (36.8 C) (Oral)   Ht  (1.626 m)   Wt 133 lb 12.8 oz (60.7 kg)   BMI 22.97 kg/m   Body mass index is  22.97 kg/m.  GENERAL: vitals reviewed and listed above, alert, oriented, appears well hydrated and in no acute distress talkative  Nl speech anxious some HEENT: atraumatic, conjunctiva  clear, no obvious abnormalities on inspection of external nose and ears  NECK: no obvious masses on inspection palpation  LUNGS: clear to auscultation bilaterally, no wheezes, rales or rhonchi, good air movement   Abdomen:  Sof,t normal bowel sounds without hepatosplenomegaly, no guarding rebound or masses no CVA tenderness  CV: HRRR, no clubbing cyanosis or  peripheral edema nl cap refill  MS: moves all extremities without noticeable focal  abnormality PSYCH: pleasant and cooperative, no obvious depression or anxiety  ASSESSMENT AND PLAN:  Discussed the following assessment and plan:  Diarrhea, unspecified type  Disturbance of memory  Cognitive decline  Mild cognitive impairment Of note she thought her appt was 1 hour earlier   And she is usually very exact in details probably has adhd and anxiety baseline and now  St memory getting worse and may notice more and more  distressing.   I advice getting appt earlier with dr Karel JarvisAquino than October to discuss this   Consider further testing   As planned before .   ( but she may present and change mind as she has in past ) see  Phone note from daughter who had a concern.   -Patient advised to return or notify health care team  if symptoms worsen ,persist or new concerns arise. Total visit 25mins > 50% spent counseling and coordinating care dsic with husband as indicated in above note and in instructions to patient .     Patient Instructions   The diarrhea  May be residual of a stomach bowel infection that is   Just  not back to normal digestion  . something like post infectious irritable bowel.   Sine you have no fever blood or other alarming sx  At this time.  Add probiotic  Fluorastor or culturelle  . Ar 2 of the  Ones advised  And give this more time to get better  If not better in another 3-4 weeks we can recheck and do further evaluation.  It is  Good that you are not taking  the tranxene/ chlorazepate   .  I want you to see Neurology Dr Karel JarvisAquino sooner than  October 19 as scheduled about the deterioration of the memory problems  .   To decide  If  other interventions and evaluation are helpful.        Neta MendsWanda K. Johnathin Vanderschaaf M.D.

## 2016-02-24 NOTE — Patient Instructions (Addendum)
  The diarrhea  May be residual of a stomach bowel infection that is   Just  not back to normal digestion  . something like post infectious irritable bowel.   Sine you have no fever blood or other alarming sx  At this time.  Add probiotic  Fluorastor or culturelle  . Ar 2 of the  Ones advised  And give this more time to get better  If not better in another 3-4 weeks we can recheck and do further evaluation.  It is  Good that you are not taking  the tranxene/ chlorazepate   .  I want you to see Neurology Dr Karel JarvisAquino sooner than  October 19 as scheduled about the deterioration of the memory problems  .   To decide  If  other interventions and evaluation are helpful.

## 2016-02-24 NOTE — Progress Notes (Signed)
Pre visit review using our clinic review tool, if applicable. No additional management support is needed unless otherwise documented below in the visit note. 

## 2016-03-02 ENCOUNTER — Other Ambulatory Visit: Payer: Self-pay | Admitting: Internal Medicine

## 2016-03-04 NOTE — Telephone Encounter (Signed)
Sent to the pharmacy by e-scribe. 

## 2016-03-09 ENCOUNTER — Encounter: Payer: Self-pay | Admitting: Neurology

## 2016-03-09 ENCOUNTER — Ambulatory Visit (INDEPENDENT_AMBULATORY_CARE_PROVIDER_SITE_OTHER): Payer: Medicare Other | Admitting: Neurology

## 2016-03-09 VITALS — BP 160/72 | HR 73 | Temp 98.1°F | Ht 64.0 in | Wt 132.0 lb

## 2016-03-09 DIAGNOSIS — F411 Generalized anxiety disorder: Secondary | ICD-10-CM

## 2016-03-09 DIAGNOSIS — G3184 Mild cognitive impairment, so stated: Secondary | ICD-10-CM | POA: Diagnosis not present

## 2016-03-09 NOTE — Patient Instructions (Signed)
1. Schedule Neuropsychological evaluation 2. Refer to Dr. Dellia CloudGutterman for psychotherapy 3. Proceed with seeing Dr. Donell BeersPlovsky 4. Continue to monitor driving 5. Minimize alcohol intake 6. Follow-up after Neuropsychological evaluation

## 2016-03-09 NOTE — Progress Notes (Signed)
NEUROLOGY FOLLOW UP OFFICE NOTE  Susa SimmondsSandra C Tagle [id number]  HISTORY OF PRESENT ILLNESS: I had the pleasure of seeing Benito MccreedySandra Treml in follow-up in the neurology clinic on 03/09/2016. The patient was last seen 4 months ago for worsening memory. She is again accompanied by her husband and daughter who help supplement the history today. MMSE in April 2017 was 25/30. Since then, she feels her memory has worsened and presents for an earlier visit with her daughter who wanted to express her concerns. The patient feels she may have gotten confused on the road but denied getting lost. Her daughter reminded her about an incident in July while tailing her husband to an unfamiliar place, she lost him and had a panic attack and could not unlock her phone such that the police was called. She reports maybe forgetting her medication once or twice, then her daughter reported that she saw her mother's pillbox still had the Citalopram from today and yesterday. The patient then agreed that she forgets medications more. She occasionally misplaces things in the house, which her husband corroborates. She reports continued anxiety and feels the citalopram is not helping much. Her daughter agrees with this and can see her mother tensing up. Her daughter expressed concerns about prn Tranxene, but states she could not find the prescription bottle in their house and feels that she has not been taking it. She does note that her mother had episodes where she thought she was heavily impaired while taking Tranxene. Her daughter has definitely noticed the short-term memory issues, but does not think it is any different from her last visit in April. She started noticing memory changes in her mother after retirement, worsening over time. She will repeat herself in the same conversation.She forgets her children's birthday. There is occasionally some confusion about what season it is, last night they had a conversation if it was summer. Her  daughter noticed she was very emotional in June, and worries about her mother's resilience. She denies any significant change in personality, no paranoia, but states that "some things are heightened a bit." She is also concerned about her mother's alcohol intake, she drinks 1-2 glasses of wine most nights. No difficulties with ADLs. She denies any headaches, dizziness, diplopia, dysarthria, dysphagia, focal numbness/tingling/weakness. No falls.   HPI: This is a 75 yo LH woman with a history of hyperlipidemia, anxiety,with memory changes. She reported that her memory is not as good as it used to be, but states that she remembers the things she needs to remember. She does not forget ordinary day to day details, but would forget a conversation if she was not engaged, or a name of a book. She has some moments where she would go blank if she were not in a comfortable situation. She is a retired Optometristnglish teacher and librarian, and states that even back then, she would forget things if she got nervous, "like a little baby panic attack." At the beginning of 2016, she was going to her book club when the roads she was familiar with were blocked. She started driving around and became more panicked, until she was "seized by complete panic" and called her husband. He instructed her to wait for him, but she apparently had seen a similar colored car and started to follow it without confirming this was her husband. After driving farther away, the car in front of her stopped and redirected her home. No further similar symptoms previously or since then. She rarely forgets her medications. She  denies any missed bill payments. She would occasionally misplace things or have difficulties multitasking. Her husband states that she would occasionally be unable to recall that they did something on a certain date. She is always asking where things are. He denies that she repeats herself excessively, but she states that her children have  told her that she had "already told them that." He denies any personality changes. She has had trouble with anxiety for many years, with a sensation of a rock on her chest. She states she was "born more anxious, and has exacerbated as she gets older." She feels that anxiety increased more when her husband was diagnosed with prostate cancer, "[place] for [place] when this started." She takes Tranxene 1/2 tablet for anxiety. Her father had memory loss.  Diagnostic Data: MRI brain without contrast showed mild diffuse atrophy and chronic microvascular disease, no acute changes. TSH and B12 normal.   PAST MEDICAL HISTORY: Past Medical History:  Diagnosis Date  . Anxiety   . Generalized headaches    MRI head 2008 negative  008  . GERD (gastroesophageal reflux disease)   . Hyperlipidemia   . Ovarian cyst    remote hx  . RLS (restless legs syndrome)     MEDICATIONS: Current Outpatient Prescriptions on File Prior to Visit  Medication Sig Dispense Refill  . aspirin EC 325 MG tablet Take 325 mg by mouth daily.     . citalopram (CELEXA) 20 MG tablet TAKE 2 TABLETS BY MOUTH EVERY DAY 180 tablet 2  . clorazepate (TRANXENE) 7.5 MG tablet TAKE 1-2 TABLETS BY MOUTH AS NEEDED (Patient not taking: Reported on 02/24/2016) 90 tablet 0  . lisinopril (PRINIVIL,ZESTRIL) 10 MG tablet TAKE 1 TABLET BY MOUTH EVERY DAY 90 tablet 0  . lovastatin (MEVACOR) 20 MG tablet TAKE 1 TABLET BY MOUTH EVERY DAY 90 tablet 1   No current facility-administered medications on file prior to visit.     ALLERGIES: Allergies  Allergen Reactions  . Aspirin Other (See Comments)    REACTION: unspecified  Gi  Can take ECOTRIN  . Ibuprofen     REACTION: burning stomach  . Naproxen Sodium     REACTION: gi upset    FAMILY HISTORY: Family History  Problem Relation Age of Onset  . Pancreatic cancer Mother   . Diabetes Father   . Heart disease Father   . Hypertension Father   . Kidney disease Father   . Melanoma    . Kidney  cancer Brother   . Colon cancer Neg Hx   . Stomach cancer Neg Hx     SOCIAL HISTORY: Social History   Social History  . Marital status: Married    Spouse name: N/A  . Number of children: N/A  . Years of education: N/A   Occupational History  . Not on file.   Social History Main Topics  . Smoking status: Never Smoker  . Smokeless tobacco: Never Used  . Alcohol use 8.4 oz/week    14 Glasses of wine per week  . Drug use: No  . Sexual activity: Not on file   Other Topics Concern  . Not on file   Social History Narrative   Retired   Married  h h of 2    Regular exercise-no   Cats   CB x 2    Husband dx prostate cancer  2015    REVIEW OF SYSTEMS: Constitutional: No fevers, chills, or sweats, no generalized fatigue, change in appetite Eyes: No visual  changes, double vision, eye pain Ear, nose and throat: No hearing loss, ear pain, nasal congestion, sore throat Cardiovascular: No chest pain, palpitations Respiratory:  No shortness of breath at rest or with exertion, wheezes GastrointestinaI: No nausea, vomiting, diarrhea, abdominal pain, fecal incontinence Genitourinary:  No dysuria, urinary retention or frequency Musculoskeletal:  No neck pain, back pain Integumentary: No rash, pruritus, skin lesions Neurological: as above Psychiatric: No depression, insomnia,+ anxiety Endocrine: No palpitations, fatigue, diaphoresis, mood swings, change in appetite, change in weight, increased thirst Hematologic/Lymphatic:  No anemia, purpura, petechiae. Allergic/Immunologic: no itchy/runny eyes, nasal congestion, recent allergic reactions, rashes  PHYSICAL EXAM: Vitals:   03/09/16 1123  BP: (!) 160/72  Pulse: 73  Temp: 98.1 F (36.7 C)   General: No acute distress, get anxious Head:  Normocephalic/atraumatic Neck: supple, no paraspinal tenderness, full range of motion Heart:  Regular rate and rhythm Lungs:  Clear to auscultation bilaterally Back: No paraspinal  tenderness Skin/Extremities: No rash, no edema Neurological Exam: alert, no aphasia or dysarthria. Fund of knowledge is appropriate.  Cranial nerves: Pupils equal, round. No facial asymmetry. Motor: moves all extremities symmetrically. Gait narrow-based and steady.  IMPRESSION: This is a 75 yo LH woman with a history of hyperlipidemia, anxiety, who presented with worsening memory. 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 MMSE today in April 2017 was 25/30 (24/30 in September 2016). Her daughter comes in today to discuss issues, including concern for worsening memory, anxiety, and other questions regarding these symptoms. All their questions and concerns were addressed and answered to the best of my ability. We had a very lengthy discussion about symptoms, diagnosis of mild cognitive impairment, and possible contribution of anxiety. She had been referred by her PCP to Dr. Donell BeersPlovsky as they feel citalopram is not helping much. They were encouraged to follow through on this. She would also benefit from seeing a psychologist for therapy and will be referred today. We discussed proceeding with Neuropsychological evaluation to help further delineate her memory issues and contribution of anxiety. Continue to monitor driving, if any concerns, we discussed doing a driving evaluation which she declines at this time. Advised minimizing alcohol intake. She will follow-up after Neuropsych eval.   Thank you for allowing me to participate in her care.  Please do not hesitate to call for any questions or concerns.  The duration of this appointment visit was 25 minutes of face-to-face time with the patient.  Greater than 50% of this time was spent in counseling, explanation of diagnosis, planning of further management, and coordination of care.   Patrcia DollyKaren Yordin Rhoda, M.D.   CC: Dr. Fabian SharpPanosh

## 2016-03-10 ENCOUNTER — Encounter: Payer: Self-pay | Admitting: Neurology

## 2016-03-19 ENCOUNTER — Ambulatory Visit (INDEPENDENT_AMBULATORY_CARE_PROVIDER_SITE_OTHER): Payer: Medicare Other | Admitting: Psychology

## 2016-03-19 ENCOUNTER — Encounter: Payer: Self-pay | Admitting: Psychology

## 2016-03-19 DIAGNOSIS — R413 Other amnesia: Secondary | ICD-10-CM

## 2016-03-19 DIAGNOSIS — F411 Generalized anxiety disorder: Secondary | ICD-10-CM

## 2016-03-19 NOTE — Progress Notes (Signed)
NEUROPSYCHOLOGICAL INTERVIEW (CPT: [id number])  Name: Cynthia Floyd Date of Birth: September 06, 1940 Date of Interview: 03/19/2016  Reason for Referral:  Cynthia SimmondsSandra C Floyd is a 75 y.o., left-handed female who is referred for neuropsychological evaluation by Dr. Patrcia DollyKaren Floyd of [facility] due to concerns about worsening memory. This patient is accompanied in the office by her husband, Cynthia Floyd. Additionally, the patient's daughter, Cynthia PikesSusan, provided collateral information via phone.  History of Presenting Problem:  Cynthia Floyd was first seen by Dr. Karel JarvisAquino for neurologic consultation in March 2016. An MMSE was completed and she scored 30/30. An MRI of the brain completed on 10/12/2014 reportedly showed chronic nonspecific white matter signal changes, mildly progressed since 2008, likely due to chronic small vessel disease. At her next appointment with Dr. Karel JarvisAquino, on 04/05/2015, she scored 24/30 on the MMSE. She was diagnosed with mild cognitive impairment but it was noted that anxiety was likely a contributing factor. She was referred to a psychotherapist who she saw for at least one visit but did not return. Upon follow-up with Dr. Karel JarvisAquino on 10/29/2015, MMSE was 25/30 and she reported ongoing anxiety, possibly increased. Cynthia Floyd's daughter accompanied to her appointment with Dr. Karel JarvisAquino on 03/09/2016 and encouraged the patient to accept Dr. Rosalyn GessAquino's referral for neuropsychological testing. She was also referred to Dr. Dellia [facility]Gutterman [facility]) but it is unknown if the patient has been contacted for an appointment.  At the interview appointment today, Cynthia Floyd reports gradual onset and progressive worsening of short term memory loss over the past couple of years. She reports she has always been prone to anxiety and worrying (sounds like performance anxiety and generalized anxiety disorder), but her memory lapses are causing her great concern and increased anxiety.   It is unclear how reliable of a  historian the patient's husband is. He accompanies her here today and acknowledges some short term memory loss, specifically repeating of herself. However, he reports he is much less concerned about her memory than she is.   Upon direct questioning, the patient and her husband reported the following:   Forgetting recent conversations/events: Yes Repeating statements/questions: Yes Misplacing/losing items: Yes Forgetting appointments or other obligations: Yes, but is trying to take measures to prevent missing appointments (e.g., writing them down, reviewing calendar) Forgetting to take medications: Started using a weekly pillbox (with individual boxes for each day) just over a week ago, and feels this system is helping her. Denies any problems since starting this system.   Difficulty concentrating: Possibly reduced Starting but not finishing tasks: Not frequently Distracted easily: Not frequently  Word-finding difficulty: Husband denies. She says if I get nervous, may have some trouble. Word substitutions: No Writing difficulty: No Spelling difficulty: No Comprehension difficulty: Denies in conversation. Also denies comprehension difficulties in most aspects of reading. Patient is an avid reader, and reads many periodicals (e.g., Time magazine, local newspapers, [employer]) weekly and does not have difficulty comprehending these. However, she notes that she is having great trouble getting through her book club's book for this month. She stated she has gotten only 20 pages into it and has to keep re-reading the pages. She is not sure if this is because she is not interested in it or if she is having true comprehension difficulty. Regardless, it is very stressful to her that this is happening. She states, "That's the biggest worry I have right now.. I know it's not life or death but I'll be very embarrassed if I don't get this thing read."  Getting lost when driving: Sometimes - have always been kind  of bad with directions but it is worse now. Husband hasn't been worried about her driving, but notes that one time she was following him in another vehicle and got lost. She apparently got distracted and made some wrong turns, and suddenly did not know where she was. She had to go into a retail store to get help. They think this incident was around a year ago.  Uncertain about directions when driving or passenger: Yes, "Sometimes I draw a blank and cannot remember"   I also spoke to the patient's daughter, Cynthia Floyd (who lives in [place] area), by phone today. Cynthia Floyd, who is very supportive of her parents and closely involved in their care, reports a gradual decline in short term memory over a long period of time. In fact, she first noticed subtle memory problems when the patient retired, approximately 14 years ago. This is also when the patient first sought treatment for anxiety (was prescribed citalopram by Dr. Vicente Floyd). Cynthia Floyd reports frequent forgetfulness for recent conversations and events. She has become more worried about the patient's ability to manage her medications. She denies any change in her mother's personality but does note that she is more emotional and cries more easily. Cynthia Floyd is also concerned about the patient's alcohol use. The patient drinks two glasses of wine nightly, but her daughter notes that when she drinks any more than this she becomes quite intoxicated.   Cynthia Floyd feels that her mother's anxiety has never been "fully under control". She was very concerned that the patient was prescribed Tranxene in the past, and seemed to be taking it daily for some period of time. It does not appear that the patient is taking this medication anymore. Cynthia Floyd notes that the patient's anxiety increased significantly when her husband was diagnosed with prostate cancer about two years ago.   Cynthia Floyd and her brother (who lives in [place]) are looking into options for their parents to move  closer to one of them (either [place] or [place]) so they can be more involved in their care and daily activities.  Family history is reportedly significant for dementia in the patient's father.   Current Functioning: Cynthia Floyd lives with her husband in their own home. She apparently continues to drive, although she and her husband took a cab to their appointment today. She independently manages medications, appointments and cooking. It is unclear how well she is managing these tasks. She reported that she turned over the finances and bill paying to her husband "a while ago", not because she was having difficulty but because he had never done it and wanted to be involved.  Physically, the patient has no complaints aside from headaches which is apparently a longstanding issue (since adolescence). She stated, "I'm a little bit tightly wound and this gives me headaches."  She denied any difficulty with walking or balance. She has not had any falls.  The patient describes her current mood as "okay" but "more worried and a little more down". She reported increased mood swings/depression in the last few years. She reported no change in sleep, noting that she has occasional difficulty falling asleep. She denied any significant change in appetite. She denied suicidal ideation or intention.  No imminent risk of self-harm was identified.  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 No hx tobacco use. No illicit substance abuse.  Medical History: Past Medical History:  Diagnosis Date  . Anxiety   . Generalized headaches    MRI head 2008 negative  008  . GERD (gastroesophageal reflux disease)   .  Hyperlipidemia   . Ovarian cyst    remote hx  . RLS (restless legs syndrome)       Current Medications:  Outpatient Encounter Prescriptions as of 03/19/2016  Medication Sig  . aspirin EC 325 MG tablet Take 325 mg by mouth daily.   . citalopram (CELEXA) 20 MG tablet TAKE 2 TABLETS BY MOUTH EVERY DAY  . clorazepate (TRANXENE) 7.5 MG tablet TAKE 1-2 TABLETS BY MOUTH AS NEEDED  . lisinopril (PRINIVIL,ZESTRIL) 10 MG tablet TAKE 1 TABLET BY MOUTH EVERY DAY  . lovastatin (MEVACOR) 20 MG tablet TAKE 1 TABLET BY MOUTH EVERY DAY   No facility-administered encounter medications on file as of 03/19/2016.    It does not appear that the patient is taking Tranxene.   Behavioral Observations:   Appearance: Neatly and appropriately dressed and groomed Gait: Ambulated independently, no abnormalities observed Speech: Fluent; normal rate, rhythm and volume. Very mild word finding difficulty. Articulate, good vocabulary. Thought process: Often circumlocutory; tangential at times (forgets purpose of story she is telling) Affect: Full, anxious, euhthymic Interpersonal: Pleasant, appropriate Repeats herself throughout the interview   TESTING: There is medical necessity to proceed with neuropsychological assessment as the results will be used to aid in differential diagnosis and clinical decision-making and to inform specific treatment recommendations. Per the patient, her husband, her daughter and medical records reviewed, there has been a change in cognitive functioning and a reasonable suspicion of dementia. Differentials include Alzheimer's disease and vascular dementia.   PLAN: The patient will return for a full battery of neuropsychological testing with a psychometrician under my supervision. Education regarding testing procedures was provided. Subsequently, the patient will see this provider for a follow-up session at which time her test performances and my impressions and treatment recommendations  will be reviewed in detail.   Full neuropsychological evaluation report to follow.

## 2016-03-30 ENCOUNTER — Ambulatory Visit (INDEPENDENT_AMBULATORY_CARE_PROVIDER_SITE_OTHER): Payer: Medicare Other | Admitting: Psychology

## 2016-03-30 DIAGNOSIS — R413 Other amnesia: Secondary | ICD-10-CM | POA: Diagnosis not present

## 2016-03-30 NOTE — Progress Notes (Signed)
   Neuropsychology Note  Cynthia SimmondsSandra C Sylla returned today for 2 hours of neuropsychological testing with technician, Wallace Kellerana Chamberlain, BS, under the supervision of Dr. Elvis CoilMaryBeth Bailar. The patient did appear very anxious during the testing session, per behavioral observation or via self-report to the technician. Rest breaks were offered. Cynthia SimmondsSandra C Weatherspoon will return within 2 weeks for a feedback session with Dr. Alinda DoomsBailar at which time her test performances, clinical impressions and treatment recommendations will be reviewed in detail. The patient understands she can contact our office should she require our assistance before this time.  Full report to follow.

## 2016-04-02 ENCOUNTER — Encounter: Payer: Self-pay | Admitting: Psychology

## 2016-04-07 ENCOUNTER — Ambulatory Visit: Payer: Self-pay | Admitting: Neurology

## 2016-04-12 NOTE — Progress Notes (Signed)
NEUROPSYCHOLOGICAL EVALUATION   Name:    Cynthia Floyd  Date of Birth:   [birth year] Date of Interview:  03/19/2016 Date of Testing:  03/30/2016  Date of Feedback:  04/13/2016      Background Information:  Reason for Referral:  Cynthia Floyd is a 75 y.o., left-handed, married female referred by Dr. Ellouise Floyd to assess her current level of cognitive functioning and assist in differential diagnosis. The current evaluation consisted of a review of available medical records, an interview with the patient and her husband, a telephone interview with her daughter, and the completion of a neuropsychological testing battery. Informed consent was obtained.  History of Presenting Problem:  Cynthia Floyd was first seen by Dr. Delice Floyd for neurologic consultation in March 2016. An MMSE was completed and she scored 30/30. An MRI of the brain completed on 10/12/2014 reportedly showed chronic nonspecific white matter signal changes, mildly progressed since 2008, likely due to chronic small vessel disease. At her next appointment with Dr. Delice Floyd, on 04/05/2015, she scored 24/30 on the MMSE. She was diagnosed with mild cognitive impairment but it was noted that anxiety was likely a contributing factor. She was referred to a psychotherapist who she saw for at least one visit but did not return. Upon follow-up with Dr. Delice Floyd on 10/29/2015, MMSE was 25/30 and she reported ongoing anxiety, possibly increased. Cynthia Floyd's daughter accompanied to her appointment with Dr. Delice Floyd on 03/09/2016 and encouraged the patient to accept Dr. Amparo Floyd referral for neuropsychological testing. She was also referred to Dr. Cheryln Floyd Cynthia Floyd) but it is unknown if the patient has been contacted for an appointment.  At the interview appointment today, Cynthia Floyd reports gradual onset and progressive worsening of short term memory loss over the past couple of years. She reports she has always been prone to anxiety  and worrying (sounds like performance anxiety and generalized anxiety disorder), but her memory lapses are causing her great concern and increased anxiety.   It is unclear how reliable of a historian the patient's husband is. He accompanies her here today and acknowledges some short term memory loss, specifically repeating of herself. However, he reports he is much less concerned about her memory than she is.   Upon direct questioning, the patient and her husband reported the following:   Forgetting recent conversations/events: Yes Repeating statements/questions: Yes Misplacing/losing items: Yes Forgetting appointments or other obligations: Yes, but is trying to take measures to prevent missing appointments (e.g., writing them down, reviewing calendar) Forgetting to take medications: Started using a weekly pillbox (with individual boxes for each day) just over a week ago, and feels this system is helping her. Denies any problems since starting this system.   Difficulty concentrating: Possibly reduced Starting but not finishing tasks: Not frequently Distracted easily: Not frequently  Word-finding difficulty: Husband denies. She says if I get nervous, may have some trouble. Word substitutions: No Writing difficulty: No Spelling difficulty: No Comprehension difficulty: Denies in conversation. Also denies comprehension difficulties in most aspects of reading. Patient is an avid reader, and reads many periodicals (e.g., Time magazine, local newspapers, [employer]) weekly and does not have difficulty comprehending these. However, she notes that she is having great trouble getting through her book club's book for this month. She stated she has gotten only 20 pages into it and has to keep re-reading the pages. She is not sure if this is because she is not interested in it or if she is having true comprehension difficulty.  Regardless, it is very stressful to her that this is happening. She states,  "That's the biggest worry I have right now.. I know it's not life or death but I'll be very embarrassed if I don't get this thing read."   Getting lost when driving: Sometimes - have always been kind of bad with directions but it is worse now. Husband hasn't been worried about her driving, but notes that one time she was following him in another vehicle and got lost. She apparently got distracted and made some wrong turns, and suddenly did not know where she was. She had to go into a retail store to get help. They think this incident was around a year ago.  Uncertain about directions when driving or passenger: Yes, "Sometimes I draw a blank and cannot remember"   I also spoke to the patient's daughter, Dr. Salina Floyd (who lives in [place] area), by phone today. Cynthia Floyd, who is very supportive of her parents and closely involved in their care, reports a gradual decline in short term memory over a long period of time. In fact, she first noticed subtle memory problems when the patient retired, approximately 14 years ago. This is also when the patient first sought treatment for anxiety (was prescribed citalopram by Dr. Lolita Floyd). Cynthia Floyd reports frequent forgetfulness for recent conversations and events. She has become more worried about the patient's ability to manage her medications. She denies any change in her mother's personality but does note that she is more emotional and cries more easily. Cynthia Floyd is also concerned about the patient's alcohol use. The patient drinks two glasses of wine nightly, but her daughter notes that when she drinks any more than this she becomes quite intoxicated.   Cynthia Floyd feels that her mother's anxiety has "never been fully under control". She was very concerned that the patient was prescribed Tranxene in the past, and seemed to be taking it daily for some period of time. It does not appear that the patient is taking this medication anymore. Cynthia Floyd notes that the patient's  anxiety increased significantly when her husband was diagnosed with prostate cancer about two years ago.   Cynthia Floyd and her brother (who lives in [place]) are looking into options for their parents to move closer to one of them (either [place] or [place]) so they can be more involved in their care and daily activities.  Family history is reportedly significant for dementia in the patient's father.   Current Functioning: Cynthia Floyd lives with her husband in their own home. She apparently continues to drive, although she and her husband took a cab to their appointment today. She independently manages medications, appointments and cooking. It is unclear how well she is managing these tasks. She reported that she turned over the finances and bill paying to her husband "a while ago", not because she was having difficulty but because he had never done it and wanted to be involved.  Physically, the patient has no complaints aside from headaches which is apparently a longstanding issue (since adolescence). She stated, "I'm a little bit tightly wound and this gives me headaches."  She denied any difficulty with walking or balance. She has not had any falls.  The patient describes her current mood as "okay" but "more worried and a little more down". She reported increased mood swings/depression in the last few years. She reported no change in sleep, noting that she has occasional difficulty falling asleep. She denied any significant change in appetite. She denied suicidal ideation  or intention.  No imminent risk of self-harm was identified.  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 No hx tobacco use. No  illicit substance abuse.   Medical History:  Past Medical History:  Diagnosis Date  . Anxiety   . Generalized headaches    MRI head 2008 negative  008  . GERD (gastroesophageal reflux disease)   . Hyperlipidemia   . Ovarian cyst    remote hx  . RLS (restless legs syndrome)     Current medications:  Outpatient Encounter Prescriptions as of 04/13/2016  Medication Sig  . aspirin EC 325 MG tablet Take 325 mg by mouth daily.   . citalopram (CELEXA) 20 MG tablet TAKE 2 TABLETS BY MOUTH EVERY DAY  . clorazepate (TRANXENE) 7.5 MG tablet TAKE 1-2 TABLETS BY MOUTH AS NEEDED  . lisinopril (PRINIVIL,ZESTRIL) 10 MG tablet TAKE 1 TABLET BY MOUTH EVERY DAY  . lovastatin (MEVACOR) 20 MG tablet TAKE 1 TABLET BY MOUTH EVERY DAY   No facility-administered encounter medications on file as of 04/13/2016.    It does not appear that the patient is taking Tranxene.    Current Examination:  Behavioral Observations:   Appearance: Neatly and appropriately dressed and groomed Gait: Ambulated independently, no abnormalities observed Speech: Fluent; normal rate, rhythm and volume. Very mild word finding difficulty. Articulate, good vocabulary. Repeats herself throughout the interview Thought process: Often circumlocutory; tangential at times (forgets purpose of story she is telling) Affect: Full, anxious, euhthymic Interpersonal: Pleasant, appropriate Orientation: Oriented to person, place, day, month and year. Disoriented to date. Unable to name the current President or his predecessor.   Tests Administered: . Test of Premorbid Functioning (TOPF) . Wechsler Adult Intelligence Scale-Fourth Edition (WAIS-IV): Similarities, Block Design, Matrix Reasoning,  Coding and Digit Span subtests . Wechsler Memory Scale-Fourth Edition (WMS-IV) Older Adult Version (ages 73-90): Logical Memory I, II and Recognition subtests  . [employer] Verbal Learning Test - 2nd Edition (CVLT-2) Short Form . Repeatable Battery  for the Assessment of Neuropsychological Status (RBANS) Form A:  Figure Copy and Recall subtests . Neuropsychological Assessment Battery (NAB) Language Module, Form 1: Auditory Comprehension and Naming Subtests . Controlled Oral Word Association Test (COWAT) . Trail Making Test A and B . Clock drawing test . [employer] [facility]) . Geriatric Depression Scale (GDS) 15 Item . Generalized Anxiety Disorder - 7 item screener (GAD-7)  Test Results: Note: Standardized scores are presented only for use by appropriately trained professionals and to allow for any future test-retest comparison. These scores should not be interpreted without consideration of all the information that is contained in the rest of the report. The most recent standardization samples from the test publisher or other sources were used whenever possible to derive standard scores; scores were corrected for age, gender, ethnicity and education when available.   Test Scores:  Test Name Standardized Score Descriptor  TOPF SS= 119 High average  WAIS-IV Subtests    Similarities ss= 9 Average  Block Design ss= 6 Low average  Matrix Reasoning ss= 8 Low end of average  Coding ss= 4 Impaired  Digit Span  ss= 10 Average  WMS-IV Subtests    LM I ss= 6 Low average  LM II ss= 1 Impaired  LM II Recognition Cum %: 3-9  RBANS Subtests    Figure Copy Z= -1.6 Borderline  Figure Recall Z= -2.7 Severely impaired  CVLT-II Scores    Trial 1 Z= -3 Severely impaired  Trial 4 Z= -2.5 Impaired  Trials 1-4 total T= 22 Severely impaired  SD Free Recall Z= -2 Impaired  LD Free Recall Z= -2 Impaired  LD Cued Recall Z= -2.5 Impaired  Recognition Discriminability (4/9 hits, 10 false positives) Z= -3 Severely impaired  Forced Choice Recognition Raw= 8/9 Abnormal  NAB Language Subtests    Auditory Comprehension T= 28 Impaired  Naming T= 19 Severely impaired  COWAT-FAS T= 33 Borderline  COWAT-Animals T= 40 Low average  Trail  Making Test A 4 errors T= <25 Severely impaired  Trail Making Test B   Discontinued   Clock Drawing  WNL   WCST Patient refused to complete   GDS-15 3/15 WNL   GAD-7 0/21 WNL      Description of Test Results:  Premorbid verbal intellectual abilities were estimated to have been within the high average to superior range based on a test of word reading. Psychomotor processing speed was impaired. Auditory attention and working memory were average. Visual-spatial construction was low average to borderline impaired. Language abilities were significantly below expectation. Specifically, confrontation naming was severely impaired, and auditory comprehension was impaired. Semantic verbal fluency was low average. With regard to verbal memory, encoding and acquisition of non-contextual information (i.e., word list) was severely impaired. After a brief distracter task, free recall was impaired. After a 10 minute delay, free recall was impaired. Cued recall was impaired. Performance on a yes/no recognition task was severely impaired. On another verbal memory test, encoding and acquisition of contextual auditory information (i.e., short stories) was low average. After a 20 minute delay, free recall was impaired. Performance on a yes/no recognition task was below expectation. With regard to non-verbal memory, delayed free recall of visual information was severely impaired. Executive functioning was variable. Mental flexibility and set-shifting were severely impaired; she was unable to complete Trails [address]. Verbal fluency with phonemic search restrictions was borderline impaired. Verbal abstract reasoning was average. Non-verbal abstract reasoning was average. Performance on a clock drawing task was within normal limits. On self-report questionnaires, the patient's responses were not  indicative of clinically significant depression/anxiety at the present time.    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 History of generalized anxiety disorder. Results of the current cognitive evaluation are clearly abnormal and reveal several areas of impairment, including in learning and memory, language (i.e., confrontation naming and auditory comprehension), visual-spatial construction and aspects of executive functioning. Additionally, there is evidence that her cognitive deficits are interfering with her ability to manage complex tasks, such as managing medications and driving. As such, diagnostic criteria for a dementia syndrome are met. The patient's cognitive profile is suggestive of medial-temporal lobe involvement. Alzheimer's disease is the most likely etiology, given her cognitive profile and clinical features. The patient did not report current depression or anxiety but she does have a history of generalized anxiety.  Recommendations: Based on the findings of the present evaluation, the following recommendations are offered:   --Cynthia Floyd may be an appropriate candidate for cholinesterase inhibitor therapy. She will follow up with Dr. Delice Floyd about this.  --Continued treatment of anxiety with Celexa is recommended. She is no longer taking tranxene and I agree that she should not take benzodiazepines given the increased risk for confusion associated with them. The patient will likely experience less anxiety if  she is a more structured atmosphere with increased supports. I am less inclined to recommend psychotherapy, given her significant memory deficits.  --The patient should receive assistance with complex ADLs, including medications, appointments, finances, cooking and transportation. Given her poor performance on a cognitive test highly correlated with driving ability, and her self-reported difficulty with getting lost on at least one occasion, it is my recommendation that she stop driving at this time. It does not appear that the patient's husband is able to provide  assistance with complex ADLs, and as such I would recommend that the family hire in-home care or consider transitioning to an assisted living facility with continuing care options for her as her disease progresses. The patient and her husband are discussing moving closer to their daughter in the [place] area.  --The patient's family will benefit from education and support. They are referred to the Alzheimer's Association ([employer]).   --The patient should continue to participate in activities which provide mental stimulation, cardiovascular exercise, and social interaction.  --The patient should limit her alcohol use as this can exacerbate cognitive dysfunction and increase risk of falls and other adverse events. I would recommend limiting her intake to no more than one standard drink per day.  --Neuropsychological re-assessment in the future (e.g. In one year) could be considered in order to monitor cognitive status, track progression of symptoms and further assist with treatment planning.    Feedback to Patient: Cynthia Floyd, her husband, and her daughter returned for a feedback appointment on 04/13/2016 to review the results of her neuropsychological evaluation with this provider. 60 minutes face-to-face time was spent reviewing her test results, my impressions and my recommendations as detailed above.    Total time spent on this patient's case: [id number] unit for interview with psychologist; [phone] units of testing by psychometrician under psychologist's supervision; [phone] units for medical record review, scoring of neuropsychological tests, interpretation of test results, preparation of this report, and review of results to the patient by psychologist.      Thank you for your referral of Cynthia Floyd. Please feel free to contact me if you have any questions or concerns regarding this report.

## 2016-04-13 ENCOUNTER — Ambulatory Visit (INDEPENDENT_AMBULATORY_CARE_PROVIDER_SITE_OTHER): Payer: Medicare Other | Admitting: Psychology

## 2016-04-13 ENCOUNTER — Encounter: Payer: Self-pay | Admitting: Psychology

## 2016-04-13 DIAGNOSIS — G301 Alzheimer's disease with late onset: Secondary | ICD-10-CM | POA: Diagnosis not present

## 2016-04-13 DIAGNOSIS — F0280 Dementia in other diseases classified elsewhere without behavioral disturbance: Secondary | ICD-10-CM

## 2016-04-16 ENCOUNTER — Telehealth: Payer: Self-pay | Admitting: Psychology

## 2016-04-16 NOTE — Progress Notes (Signed)
Spoke with [name], patient's daughter, via phone on 04/16/2016. Reviewed recommendations we had discussed in our in-person meeting earlier this week, as [name] prepares to visit continuing care communities.

## 2016-04-16 NOTE — Telephone Encounter (Signed)
-----   [facility] M Cantey sent at 04/14/2016  1:54 PM EDT ----- PT's daughter [name] called and said she has a question for you/Dawn CB# [phone]

## 2016-04-25 ENCOUNTER — Other Ambulatory Visit: Payer: Self-pay | Admitting: Internal Medicine

## 2016-04-28 ENCOUNTER — Ambulatory Visit (INDEPENDENT_AMBULATORY_CARE_PROVIDER_SITE_OTHER): Payer: Medicare Other | Admitting: Neurology

## 2016-04-28 ENCOUNTER — Encounter: Payer: Self-pay | Admitting: Neurology

## 2016-04-28 VITALS — BP 146/72 | HR 76 | Temp 98.1°F | Ht 64.0 in | Wt 130.6 lb

## 2016-04-28 DIAGNOSIS — G301 Alzheimer's disease with late onset: Secondary | ICD-10-CM

## 2016-04-28 DIAGNOSIS — F0280 Dementia in other diseases classified elsewhere without behavioral disturbance: Secondary | ICD-10-CM

## 2016-04-28 MED ORDER — DONEPEZIL HCL 10 MG PO TABS: ORAL_TABLET | ORAL | 11 refills | Status: DC

## 2016-04-28 NOTE — Patient Instructions (Signed)
1. Start Donepezil (Aricept) 10mg : Take 1/2 tablet daily for 1 month, then increase to 1 tablet daily 2. Continue all your other medications as prescribed 3. Control of blood pressure, cholesterol, as well as physical exercise and brain stimulation exercises are important for brain health 4. It is recommended that you stop driving 5. For further information (ie clinical trials), look into the Alzheimer's Association website:  [employer]dkHttps://www.alz.org/ 6. Follow-up in 6 months, call for any changes

## 2016-04-28 NOTE — Telephone Encounter (Signed)
Sent to the pharmacy by e-scribe for 6 months.  Pt has upcoming appt on 05/08/16.  Last labs 12/31/15.
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
## 2016-05-07 ENCOUNTER — Ambulatory Visit: Payer: Self-pay | Admitting: Neurology

## 2016-05-08 ENCOUNTER — Encounter: Payer: Self-pay | Admitting: Internal Medicine

## 2016-05-08 ENCOUNTER — Ambulatory Visit (INDEPENDENT_AMBULATORY_CARE_PROVIDER_SITE_OTHER): Payer: Medicare Other | Admitting: Internal Medicine

## 2016-05-08 ENCOUNTER — Ambulatory Visit: Payer: Self-pay | Admitting: Internal Medicine

## 2016-05-08 VITALS — BP 170/90 | HR 74 | Temp 97.9°F | Wt 128.8 lb

## 2016-05-08 DIAGNOSIS — R4189 Other symptoms and signs involving cognitive functions and awareness: Secondary | ICD-10-CM

## 2016-05-08 DIAGNOSIS — Z23 Encounter for immunization: Secondary | ICD-10-CM | POA: Diagnosis not present

## 2016-05-08 DIAGNOSIS — Z79899 Other long term (current) drug therapy: Secondary | ICD-10-CM

## 2016-05-08 DIAGNOSIS — F411 Generalized anxiety disorder: Secondary | ICD-10-CM

## 2016-05-08 DIAGNOSIS — I10 Essential (primary) hypertension: Secondary | ICD-10-CM | POA: Diagnosis not present

## 2016-05-08 MED ORDER — LISINOPRIL 20 MG PO TABS: 20.0000 mg | ORAL_TABLET | Freq: Every day | ORAL | 1 refills | Status: AC

## 2016-05-08 NOTE — Patient Instructions (Addendum)
You bp is up again today    Making sure   BP At goal if important for brain health .  Increase the lisinopril  To 20 mg per day . For now  We may have to add more medication also but  I would like you to return in about  6 weeks to decide on medication adjustments .  Please optimize aerobic exercise such as walking .  Stay on the statin medication  On  cholesterol also.    Get your sleep.

## 2016-05-08 NOTE — Progress Notes (Signed)
Chief Complaint  Patient presents with  . Follow-up    HPI: Cynthia Floyd 75 y.o.   sda appt     For ?    Though it was a check up. Here seen by herself and with her husband. She has understandably distressed with a diagnosis newly of late onset mild Alzheimer's dementia. She was just placed on Aricept has not been on a long starting to tolerate it but had side effects initially. Heart races at times from anxiety. She is no longer taking any kinds of mind altering medication but on her Celexa. Her family has her pills put in a pillbox for safety reasons. She has been able to talk well remember past with very good clarity. Family and she is looking into moving closer to her child and grandchild in [place] for future plans. She is taking lisinopril without problem. ROS: See pertinent positives and negatives per HPI.  Past Medical History:  Diagnosis Date  . Anxiety   . Generalized headaches    MRI head 2008 negative  008  . GERD (gastroesophageal reflux disease)   . Hyperlipidemia   . Ovarian cyst    remote hx  . RLS (restless legs syndrome)     Family History  Problem Relation Age of Onset  . Pancreatic cancer Mother   . Diabetes Father   . Heart disease Father   . Hypertension Father   . Kidney disease Father   . Melanoma    . Kidney cancer Brother   . Colon cancer Neg Hx   . Stomach cancer Neg Hx     Social History   Social History  . Marital status: Married    Spouse name: N/A  . Number of children: N/A  . Years of education: N/A   Social History Main Topics  . Smoking status: Never Smoker  . Smokeless tobacco: Never Used  . Alcohol use 8.4 oz/week    14 Glasses of wine per week  . Drug use: No  . Sexual activity: Not Asked   Other Topics Concern  . None   Social History Narrative   Retired   Married  h h of 2    Regular exercise-no   Cats   CB x 2    Husband dx prostate cancer  2015    Outpatient Medications Prior to Visit    Medication Sig Dispense Refill  . aspirin EC 325 MG tablet Take 325 mg by mouth daily.     . citalopram (CELEXA) 20 MG tablet TAKE 2 TABLETS BY MOUTH EVERY DAY 180 tablet 2  . donepezil (ARICEPT) 10 MG tablet Take 1/2 tablet daily for 1 month, then increase to 1 tablet daily 30 tablet 11  . lovastatin (MEVACOR) 20 MG tablet TAKE 1 TABLET BY MOUTH EVERY DAY 90 tablet 1  . lisinopril (PRINIVIL,ZESTRIL) 10 MG tablet TAKE 1 TABLET BY MOUTH EVERY DAY 90 tablet 1   No facility-administered medications prior to visit.      EXAM:  BP (!) 170/90   Pulse 74   Temp 97.9 F (36.6 C) (Oral)   Wt 128 lb 12.8 oz (58.4 kg)   SpO2 97%   BMI 22.11 kg/m   Body mass index is 22.11 kg/m.  GENERAL: vitals reviewed and listed above, alert, oriented, appears well hydrated and in no acute distress HEENT: atraumatic, conjunctiva  clear, no obvious abnormalities on inspection of external nose and ears  NECK: no obvious masses on inspection palpation  LUNGS:  clear to auscultation bilaterally, no wheezes, rales or rhonchi, good air movement CV: HRRR, no clubbing cyanosis or  peripheral edema nl cap refill  Gait is within normal limits as is conversation. She is mildly anxious. Her speech flow is normal. MS: moves all extremities without noticeable focal  abnormality PSYCH: pleasant and cooperative,  BP Readings from Last 3 Encounters:  05/08/16 (!) 170/90  04/28/16 (!) 146/72  03/09/16 (!) 160/72   Wt Readings from Last 3 Encounters:  05/08/16 128 lb 12.8 oz (58.4 kg)  04/28/16 130 lb 9 oz (59.2 kg)  03/09/16 132 lb (59.9 kg)   Lab Results  Component Value Date   WBC 6.0 12/31/2015   HGB 12.8 12/31/2015   HCT 38.8 12/31/2015   PLT 306.0 12/31/2015   GLUCOSE 104 (H) 12/31/2015   CHOL 222 (H) 12/31/2015   TRIG 110.0 12/31/2015   HDL 83.40 12/31/2015   LDLDIRECT 146.6 11/14/2012   LDLCALC 116 (H) 12/31/2015   ALT 21 12/31/2015   AST 20 12/31/2015   NA 141 12/31/2015   K 3.9 12/31/2015    CL 106 12/31/2015   CREATININE 0.76 12/31/2015   BUN 17 12/31/2015   CO2 26 12/31/2015   TSH 0.58 12/31/2015   HGBA1C 6.0 12/31/2014     ASSESSMENT AND PLAN:  Discussed the following assessment and plan:  Essential hypertension  Need for prophylactic vaccination and inoculation against influenza - Plan: Flu vaccine HIGH DOSE PF  Cognitive decline - felt to be late onset alz faiother had? opassed age 89   Anxiety state  Medication management bp is up today and we can  Intensify rx  Although how myuch is anxiety uncertain  Do not use meds that impair cognition.  Discuss knowledge base questions answered as best possible. Encourage to optimize lifestyle and get a better handle on blood pressure control. Increase lisinopril to 20 mg a day. Follow-up in the short run consideration of adding another medicine such as CCB. -Patient advised to return or notify health care team  if symptoms worsen ,persist or new concerns arise. Total visit 25mins > 50% spent counseling and coordinating care as indicated in above note and in instructions to patient .   Patient Instructions  You bp is up again today    Making sure   BP At goal if important for brain health .  Increase the lisinopril  To 20 mg per day . For now  We may have to add more medication also but  I would like you to return in about  6 weeks to decide on medication adjustments .  Please optimize aerobic exercise such as walking .  Stay on the statin medication  On  cholesterol also.    Get your sleep.       Neta MendsWanda K. Keyontae Huckeby M.D.

## 2016-05-08 NOTE — Progress Notes (Signed)
Pre visit review using our clinic review tool, if applicable. No additional management support is needed unless otherwise documented below in the visit note. 

## 2016-05-18 ENCOUNTER — Telehealth: Payer: Self-pay | Admitting: Internal Medicine

## 2016-05-18 NOTE — Telephone Encounter (Signed)
Patient Name: Cynthia MccreedySANDRA Floyd DOB: [birth year] Initial Comment Caller says, wanted to confirm if her mother had ever been tested for a deficiency w/ B 12 , due to her mild dementia Nurse Assessment Nurse: Charna Elizabethrumbull, RN, Cathy Date/Time (Eastern Time): 05/18/2016 12:19:43 PM Confirm and document reason for call. If symptomatic, describe symptoms. You must click the next button to save text entered. ---[name] states that her mother has been having memory difficulty over the past couple of years. No new or worsening symptoms. She is calling just to see if her mother had a B12 level checked. Per record her B12 level was 584 on 10/03/14. [name] asks if the MD wants to repeat her mother's B12 labwork. She is aware that we will forward her question to the office. Encouraged to call back with any concerns, questions or changes in her mother's condition. Has the patient traveled out of the country within the last 30 days? ---Not Applicable Does the patient have any new or worsening symptoms? ---No Guidelines Guideline Title Affirmed Question Affirmed Notes Final Disposition User Clinical Call Trumbull, RN, Cathy Referrals GO TO FACILITY OTHER - SPECIFY

## 2016-05-18 NOTE — Telephone Encounter (Signed)
Last B-12 was with Neurology in 2016. Will route to PCP team to update patient on provider's plan of labs

## 2016-05-19 NOTE — Telephone Encounter (Signed)
No compelling reason to recheck the b12 level as  no change and no anemia     this was also reviewed  By the neurologist who did not  Order repeat . But can send this message to her also.

## 2016-05-19 NOTE — Telephone Encounter (Signed)
Pt daughter is returning misty calll

## 2016-05-19 NOTE — Telephone Encounter (Signed)
Left a message for a return call.

## 2016-05-19 NOTE — Telephone Encounter (Signed)
I agree, I don't see a reason to repeat it. Thanks

## 2016-05-20 NOTE — Telephone Encounter (Signed)
Spoke to Pymatuning SouthSusan and informed her that Dr. Fabian SharpPanosh has spoke to Dr. Karel JarvisAquino and no need to retest at this time.  No other questions at this time.

## 2016-06-01 ENCOUNTER — Ambulatory Visit: Payer: Self-pay | Admitting: Internal Medicine

## 2016-06-10 DIAGNOSIS — G301 Alzheimer's disease with late onset: Secondary | ICD-10-CM

## 2016-06-10 DIAGNOSIS — R4189 Other symptoms and signs involving cognitive functions and awareness: Secondary | ICD-10-CM

## 2016-06-10 DIAGNOSIS — R413 Other amnesia: Secondary | ICD-10-CM

## 2016-06-18 NOTE — Progress Notes (Signed)
Pre visit review using our clinic review tool, if applicable. No additional management support is needed unless otherwise documented below in the visit note.  Chief Complaint  Patient presents with  . Follow-up    BP Reading    HPI: Cynthia SimmondsSandra C Floyd 75 y.o.  Fu Cynthia Floyd  comes in today for follow up of  A number of issues.  BP inc to 20 lisinopril without any side effects. No cough thinks her blood pressures okay but hasn't really checked it doesn't feel particularly anxious today. [facility] ppd for  Assisted living  moving to [place] in a week or so. She is on Aricept denies any major side effect. She is here with her husband today. He seems to think that she is same as usual. ROS: See pertinent positives and negatives per HPI. No cough chest pain shortness of breath.  Past Medical History:  Diagnosis Date  . Abnormal LFTs 04/15/2011   Echo 2009 showed mild fatty liver  Levels normal to elevated at times  Does not seem to be statin related    . Anxiety   . Generalized headaches    MRI head 2008 negative  008  . GERD (gastroesophageal reflux disease)   . Hyperlipidemia   . Ovarian cyst    remote hx  . RLS (restless legs syndrome)     Family History  Problem Relation Age of Onset  . Pancreatic cancer Mother   . Diabetes Father   . Heart disease Father   . Hypertension Father   . Kidney disease Father   . Melanoma    . Kidney cancer Brother   . Colon cancer Neg Hx   . Stomach cancer Neg Hx     Social History   Social History  . Marital status: Married    Spouse name: N/A  . Number of children: N/A  . Years of education: N/A   Social History Main Topics  . Smoking status: Never Smoker  . Smokeless tobacco: Never Used  . Alcohol use 8.4 oz/week    14 Glasses of wine per week  . Drug use: No  . Sexual activity: Not Asked   Other Topics Concern  . None   Social History Narrative   Retired   Married  h h of 2    Regular exercise-no   Cats   CB x 2    Husband dx prostate cancer  2015    Outpatient Medications Prior to Visit  Medication Sig Dispense Refill  . citalopram (CELEXA) 20 MG tablet TAKE 2 TABLETS BY MOUTH EVERY DAY 180 tablet 2  . donepezil (ARICEPT) 10 MG tablet Take 1/2 tablet daily for 1 month, then increase to 1 tablet daily 30 tablet 11  . lisinopril (PRINIVIL,ZESTRIL) 20 MG tablet Take 1 tablet (20 mg total) by mouth daily. 90 tablet 1  . lovastatin (MEVACOR) 20 MG tablet TAKE 1 TABLET BY MOUTH EVERY DAY 90 tablet 1  . aspirin EC 325 MG tablet Take 325 mg by mouth daily.      No facility-administered medications prior to visit.      EXAM:  BP (!) 146/80   Temp 97.6 F (36.4 C) (Oral)   Ht 5\' 4"  (1.626 m)   Wt 128 lb 12.8 oz (58.4 kg)   BMI 22.11 kg/m   Body mass index is 22.11 kg/m. Repeat blood pressure 148/78 146/80. GENERAL: vitals reviewed and listed above, alert, oriented, appears well hydrated and in no acute distress HEENT: atraumatic, conjunctiva  clear, no obvious abnormalities on inspection of external nose and ears  NECK: no obvious masses on inspection palpation  LUNGS: clear to auscultation bilaterally, no wheezes, rales or rhonchi,  CV: HRRR, no clubbing cyanosis or  peripheral edema nl cap refill  MS: moves all extremities without noticeable focal  abnormality PSYCH: pleasant and cooperative,  BP Readings from Last 3 Encounters:  06/19/16 (!) 146/80  05/08/16 (!) 170/90  04/28/16 (!) 146/72   Wt Readings from Last 3 Encounters:  06/19/16 128 lb 12.8 oz (58.4 kg)  05/08/16 128 lb 12.8 oz (58.4 kg)  04/28/16 130 lb 9 oz (59.2 kg)      ASSESSMENT AND PLAN:  Discussed the following assessment and plan:  Essential hypertension - Uncertain control transitioning moving prescription for low-dose amlodipine could be added if continued elevation before she establishes with new medical home.   Screening-pulmonary TB - Plan: PPD  Medication management  Late onset Alzheimer's disease  without behavioral disturbance  Moving to new residence  Make sure to return  for PPD reading -Patient advised to return or notify health care team  if symptoms worsen ,persist or new concerns arise.  Patient Instructions  Blood pressure is till up some  And will need follow up   To make sure in optimum control .  We can add  Medication  But  Since you are moving  In a week would like to have   New medical home help with this .   Please  Have bp checking after move   2 x per day for a week      Can See local medical care provider      Consideration of adding    A low dose  amlidipine 2.5 mg per day but not until settled and  Can have bp follow up.  I have printed out the prescription . Wishing you the best on your move .     Neta MendsWanda K. Arad Burston M.D.

## 2016-06-19 ENCOUNTER — Encounter: Payer: Self-pay | Admitting: Internal Medicine

## 2016-06-19 ENCOUNTER — Ambulatory Visit (INDEPENDENT_AMBULATORY_CARE_PROVIDER_SITE_OTHER): Payer: Medicare Other | Admitting: Internal Medicine

## 2016-06-19 VITALS — BP 146/80 | Temp 97.6°F | Ht 64.0 in | Wt 128.8 lb

## 2016-06-19 DIAGNOSIS — I10 Essential (primary) hypertension: Secondary | ICD-10-CM | POA: Diagnosis not present

## 2016-06-19 DIAGNOSIS — Z111 Encounter for screening for respiratory tuberculosis: Secondary | ICD-10-CM | POA: Diagnosis not present

## 2016-06-19 DIAGNOSIS — IMO0001 Reserved for inherently not codable concepts without codable children: Secondary | ICD-10-CM

## 2016-06-19 DIAGNOSIS — G301 Alzheimer's disease with late onset: Secondary | ICD-10-CM

## 2016-06-19 DIAGNOSIS — Z79899 Other long term (current) drug therapy: Secondary | ICD-10-CM | POA: Diagnosis not present

## 2016-06-19 DIAGNOSIS — Z598 Other problems related to housing and economic circumstances: Secondary | ICD-10-CM

## 2016-06-19 DIAGNOSIS — F0280 Dementia in other diseases classified elsewhere without behavioral disturbance: Secondary | ICD-10-CM

## 2016-06-19 MED ORDER — AMLODIPINE BESYLATE 2.5 MG PO TABS: 2.5000 mg | ORAL_TABLET | Freq: Every day | ORAL | 3 refills | Status: AC

## 2016-06-19 NOTE — Patient Instructions (Addendum)
Blood pressure is till up some  And will need follow up   To make sure in optimum control .  We can add  Medication  But  Since you are moving  In a week would like to have   New medical home help with this .   Please  Have bp checking after move   2 x per day for a week      Can See local medical care provider      Consideration of adding    A low dose  amlidipine 2.5 mg per day but not until settled and  Can have bp follow up.  I have printed out the prescription . Wishing you the best on your move .

## 2016-06-20 ENCOUNTER — Telehealth: Payer: Self-pay | Admitting: Internal Medicine

## 2016-06-22 LAB — TB SKIN TEST: TB Skin Test: NEGATIVE

## 2016-06-22 NOTE — Telephone Encounter (Signed)
Pt has 2 pills left.

## 2016-06-22 NOTE — Telephone Encounter (Signed)
Sent to the pharmacy by e-scribe.  Last lipid 12/2015.

## 2016-06-22 NOTE — Telephone Encounter (Signed)
Pt states she is moving and has only 2 tabs left of her lovastatin (MEVACOR) 20 MG tablet  Pt would like several refills to get her through to she finds another md.  [facility] [address]

## 2016-11-06 ENCOUNTER — Ambulatory Visit: Payer: Self-pay | Admitting: Neurology

## 2016-11-17 ENCOUNTER — Other Ambulatory Visit: Payer: Self-pay

## 2016-11-17 DIAGNOSIS — F0280 Dementia in other diseases classified elsewhere without behavioral disturbance: Secondary | ICD-10-CM

## 2016-11-17 DIAGNOSIS — G301 Alzheimer's disease with late onset: Principal | ICD-10-CM

## 2016-11-17 MED ORDER — DONEPEZIL HCL 10 MG PO TABS: ORAL_TABLET | ORAL | 2 refills | Status: AC

## 2016-11-27 ENCOUNTER — Other Ambulatory Visit: Payer: Self-pay

## 2016-11-27 MED ORDER — DONEPEZIL HCL 10 MG PO TABS: 10.0000 mg | ORAL_TABLET | Freq: Every day | ORAL | 1 refills | Status: AC

## 2017-04-09 ENCOUNTER — Encounter: Payer: Self-pay | Admitting: Internal Medicine

## 2017-06-08 ENCOUNTER — Other Ambulatory Visit: Payer: Self-pay | Admitting: Neurology

## 2017-09-03 ENCOUNTER — Other Ambulatory Visit: Payer: Self-pay | Admitting: Internal Medicine

## 2017-09-03 DIAGNOSIS — Z1231 Encounter for screening mammogram for malignant neoplasm of breast: Secondary | ICD-10-CM

## 2017-09-22 ENCOUNTER — Ambulatory Visit (INDEPENDENT_AMBULATORY_CARE_PROVIDER_SITE_OTHER): Payer: No Typology Code available for payment source | Admitting: Vascular Neurology

## 2017-10-04 ENCOUNTER — Ambulatory Visit
Admission: RE | Admit: 2017-10-04 | Discharge: 2017-10-04 | Disposition: A | Discharge status: Home / Self Care | Payer: No Typology Code available for payment source | Source: Ambulatory Visit | Attending: Internal Medicine | Admitting: Internal Medicine

## 2017-10-04 DIAGNOSIS — Z1231 Encounter for screening mammogram for malignant neoplasm of breast: Secondary | ICD-10-CM | POA: Insufficient documentation

## 2017-10-08 ENCOUNTER — Encounter (INDEPENDENT_AMBULATORY_CARE_PROVIDER_SITE_OTHER): Payer: Self-pay

## 2017-10-14 ENCOUNTER — Ambulatory Visit (INDEPENDENT_AMBULATORY_CARE_PROVIDER_SITE_OTHER): Payer: No Typology Code available for payment source | Admitting: Neurology

## 2017-10-14 ENCOUNTER — Encounter (INDEPENDENT_AMBULATORY_CARE_PROVIDER_SITE_OTHER): Payer: Self-pay | Admitting: Neurology

## 2017-10-14 VITALS — BP 131/69 | HR 69 | Resp 16 | Ht 64.0 in | Wt 130.0 lb

## 2017-10-14 DIAGNOSIS — F0280 Dementia in other diseases classified elsewhere without behavioral disturbance: Secondary | ICD-10-CM | POA: Insufficient documentation

## 2017-10-14 DIAGNOSIS — R251 Tremor, unspecified: Secondary | ICD-10-CM

## 2017-10-14 DIAGNOSIS — G309 Alzheimer's disease, unspecified: Secondary | ICD-10-CM | POA: Insufficient documentation

## 2017-10-14 MED ORDER — MEMANTINE HCL 5 MG PO TABS
5.00 mg | ORAL_TABLET | Freq: Two times a day (BID) | ORAL | 2 refills | Status: DC
Start: 2017-10-14 — End: 2017-11-08

## 2017-10-14 NOTE — Progress Notes (Signed)
Subjective:      Patient ID: Deborah Valencia is a 77 y.o. female.      HPI  This 77 year old woman who presents here to establish neurology care for Alzheimer's dementia.  She is here with her daughter who also provides history.  The patient was diagnosed with Alzheimer's dementia in September 2017.  She was started on donepezil and the daughter states that initially they noticed some improvement.  She moved to [place] to be with her daughter.  Her husband also has dementia.    Past medical history Alzheimer's dementia, restless legs, high blood pressure, anxiety, stress, depression, social history denies smoking family history, there may have been some history of memory problems in the patient's father.  There is family history of tremors.    The following portions of the patient's history were reviewed and updated as appropriate: allergies, current medications, past family history, past medical history, past social history, past surgical history and problem list.    Review of Systems  Reviewed all systems and negative other than what is mentioned above.  Objective:   Neurologic Exam  Vital Signs:  BP 131/69   Pulse 69   Resp 16   Ht 1.626 m (5\' 4" )   Wt 59 kg (130 lb)   BMI 22.31 kg/m     General: The patient was well developed and well nourished.  No acute distress. Cooperative with the exam.  Neck:  no carotid bruits  CVS: S1, S2 heard  Resp: CTA bilaterally  Extremities: no pedal edema, extremities normal in color    Mental Status: The patient was awake, alert and oriented to person, place, and time.  Affect is normal.  Attention span and concentration appear normal.  Language function is normal. There is no evidence of aphasia in conversational speech.    Cranial nerves:   -CN II: Visual fields full to bedside confrontation   -CN III, IV, VI: Pupils equal, round, and reactive to light; extraocular movements intact; no ptosis              -CN V: Facial sensation intact in V1 through V3 distributions    -CN VII: Face symmetric   -CN VIII: Hearing intact to conversational speech   -CN IX, X: Palate elevates symmetrically; normal phonation   -CN XI: Symmetric full strength of sternocleidomastoid and trapezius muscles   -CN XII: Tongue protrudes midline    Motor: Muscle tone normal without spasticity or flaccidity. No atrophy.  No pronator drift.  Strength  R / L    R / L  Deltoid  5 / 5  Hip Flexion 5 / 5  Triceps  5 / 5   Hip extension 5 / 5  Biceps  5 / 5   Knee flexion 5 / 5  Wrist ext 5 / 5  Knee ext 5 / 5  Wrist flexion 5 / 5  Dorsiflexion 5 / 5  FF   5 / 5  Plantar flexion 5 / 5    Sensory:   Light touch intact.    Reflexes:  R / L     R / L  Biceps  2 / 2  Knees  2 / 2  Triceps 2 / 2  Ankles  2 / 2  BR                   2 / 2  Plantars Flexor / Flexor    Coordination: FTN intact.  Occasional tremors.  Gait: Station normal, gait stable   Tandem walk intact.   Romberg negative.    Assessment:     See below.    Plan:     Impression and plan:  This is a 77 year old woman who presents here to establish neurology care for Alzheimer's dementia.  She has had testing done including neuropsychological testing at this time would recommend neuropsychological testing to follow up on many progression.  Patient is taking donepezil, continue that.  We will add Namenda.  Start with 5 mg once a day for 2 weeks and then increase it to twice a day.  We can increase the dose further.    1. Alzheimer's dementia without behavioral disturbance, unspecified timing of dementia onset  - memantine (NAMENDA) 5 MG tablet; Take 1 tablet (5 mg total) by mouth 2 (two) times daily.  Dispense: 60 tablet; Refill: 2  - Ambulatory referral to Neuropsychology    2. Tremor    Follow up after tests with our movement disorder specialist regarding tremors. Further plans will be made as test results are available. The patient will call if there are any questions before that. The patient should seek emergent care if there is any change in the  symptoms.     Gwenevere Abbot, MD - [facility]   Board Certified in Neurology by ABPN   Board Certified Clinical Neurophysiology by ABPN   Board Certified in Electrodiagnostic Medicine by ABEM

## 2017-10-18 ENCOUNTER — Encounter (INDEPENDENT_AMBULATORY_CARE_PROVIDER_SITE_OTHER): Payer: Self-pay

## 2017-11-08 ENCOUNTER — Other Ambulatory Visit (INDEPENDENT_AMBULATORY_CARE_PROVIDER_SITE_OTHER): Payer: Self-pay

## 2017-11-08 DIAGNOSIS — G309 Alzheimer's disease, unspecified: Secondary | ICD-10-CM

## 2017-11-08 MED ORDER — MEMANTINE HCL 5 MG PO TABS
5.00 mg | ORAL_TABLET | Freq: Two times a day (BID) | ORAL | 2 refills | Status: DC
Start: 2017-11-08 — End: 2018-08-02

## 2017-12-06 ENCOUNTER — Other Ambulatory Visit (INDEPENDENT_AMBULATORY_CARE_PROVIDER_SITE_OTHER): Payer: Self-pay | Admitting: Neurology

## 2017-12-06 ENCOUNTER — Other Ambulatory Visit: Payer: Self-pay | Admitting: Neurology

## 2018-05-06 ENCOUNTER — Encounter (INDEPENDENT_AMBULATORY_CARE_PROVIDER_SITE_OTHER): Payer: Self-pay | Admitting: Neurology

## 2018-06-06 ENCOUNTER — Other Ambulatory Visit (INDEPENDENT_AMBULATORY_CARE_PROVIDER_SITE_OTHER): Payer: Self-pay | Admitting: Neurology

## 2018-08-02 ENCOUNTER — Other Ambulatory Visit (INDEPENDENT_AMBULATORY_CARE_PROVIDER_SITE_OTHER): Payer: Self-pay | Admitting: Neurology

## 2018-08-02 DIAGNOSIS — F0280 Dementia in other diseases classified elsewhere without behavioral disturbance: Secondary | ICD-10-CM

## 2018-08-02 NOTE — Telephone Encounter (Signed)
Please verify.

## 2018-08-22 ENCOUNTER — Encounter (INDEPENDENT_AMBULATORY_CARE_PROVIDER_SITE_OTHER): Payer: Self-pay | Admitting: Neurology

## 2018-11-28 ENCOUNTER — Encounter (INDEPENDENT_AMBULATORY_CARE_PROVIDER_SITE_OTHER): Payer: Self-pay | Admitting: Neurology

## 2018-11-28 ENCOUNTER — Telehealth (INDEPENDENT_AMBULATORY_CARE_PROVIDER_SITE_OTHER): Payer: Self-pay

## 2018-11-28 ENCOUNTER — Other Ambulatory Visit (INDEPENDENT_AMBULATORY_CARE_PROVIDER_SITE_OTHER): Payer: Self-pay | Admitting: Neurology

## 2018-11-28 NOTE — Telephone Encounter (Signed)
The phone number in her chart is her daughter [name]. Call her number, no answer, left a voice mail regarding her medication donepezil, Dr. Kulkarni is only giving patient one refill. She needs an apt for farther refills. Last office visit 09/2017.   Daughter was notify on the voicemail and the number of scheduling was given.

## 2018-11-28 NOTE — Telephone Encounter (Signed)
The phone number in her chart is her daughter Darl Pikes. Call her number, no answer, left a voice mail regarding her medication donepezil, Dr. Lonia Mad is only giving patient one refill. She needs an apt for farther refills. Last office visit 09/2017.   Daughter was notify on the voicemail and the number of scheduling was given.

## 2018-11-28 NOTE — Telephone Encounter (Signed)
Needs follow-up

## 2018-11-28 NOTE — Progress Notes (Signed)
Left VM on number on file for pt to schedule a FU visit for future refills.

## 2018-11-28 NOTE — Telephone Encounter (Signed)
I gave one refill.  

## 2019-02-20 ENCOUNTER — Other Ambulatory Visit (INDEPENDENT_AMBULATORY_CARE_PROVIDER_SITE_OTHER): Payer: Self-pay | Admitting: Neurology

## 2019-02-20 NOTE — Telephone Encounter (Signed)
I will give one refill. Needs a follow up appointment. Inform pharmacy, PCP and patient.

## 2019-02-22 NOTE — Telephone Encounter (Signed)
Called patient no answer on her daughter cell phone:  She was advice on the voice message to schedule a follow up apt  For future refills her last refill was approved if there is not future apt there would be no   More medication prescribe from the office.

## 2019-10-26 ENCOUNTER — Other Ambulatory Visit: Payer: Self-pay | Admitting: Internal Medicine

## 2019-10-26 DIAGNOSIS — M810 Age-related osteoporosis without current pathological fracture: Secondary | ICD-10-CM

## 2019-10-26 DIAGNOSIS — Z1231 Encounter for screening mammogram for malignant neoplasm of breast: Secondary | ICD-10-CM

## 2019-11-03 ENCOUNTER — Ambulatory Visit
Admission: RE | Admit: 2019-11-03 | Discharge: 2019-11-03 | Disposition: A | Discharge status: Home / Self Care | Payer: Medicare PPO | Source: Ambulatory Visit | Attending: Internal Medicine | Admitting: Internal Medicine

## 2019-11-03 ENCOUNTER — Other Ambulatory Visit: Payer: Self-pay | Admitting: Internal Medicine

## 2019-11-03 DIAGNOSIS — M810 Age-related osteoporosis without current pathological fracture: Secondary | ICD-10-CM | POA: Insufficient documentation

## 2019-11-03 DIAGNOSIS — Z1231 Encounter for screening mammogram for malignant neoplasm of breast: Secondary | ICD-10-CM | POA: Insufficient documentation

## 2021-08-06 ENCOUNTER — Emergency Department
Admission: EM | Admit: 2021-08-06 | Discharge: 2021-08-06 | Disposition: A | Discharge status: Home / Self Care | Payer: Medicare PPO | Attending: Emergency Medicine | Admitting: Emergency Medicine

## 2021-08-06 ENCOUNTER — Emergency Department: Payer: Medicare PPO

## 2021-08-06 DIAGNOSIS — S0990XA Unspecified injury of head, initial encounter: Secondary | ICD-10-CM | POA: Insufficient documentation

## 2021-08-06 DIAGNOSIS — R079 Chest pain, unspecified: Secondary | ICD-10-CM

## 2021-08-06 DIAGNOSIS — Y92121 Bathroom in nursing home as the place of occurrence of the external cause: Secondary | ICD-10-CM | POA: Insufficient documentation

## 2021-08-06 DIAGNOSIS — S0181XA Laceration without foreign body of other part of head, initial encounter: Secondary | ICD-10-CM | POA: Insufficient documentation

## 2021-08-06 DIAGNOSIS — W19XXXA Unspecified fall, initial encounter: Secondary | ICD-10-CM | POA: Insufficient documentation

## 2021-08-06 DIAGNOSIS — S0511XA Contusion of eyeball and orbital tissues, right eye, initial encounter: Secondary | ICD-10-CM

## 2021-08-06 DIAGNOSIS — S0083XA Contusion of other part of head, initial encounter: Secondary | ICD-10-CM | POA: Insufficient documentation

## 2021-08-06 HISTORY — DX: Unspecified dementia, unspecified severity, without behavioral disturbance, psychotic disturbance, mood disturbance, and anxiety: F03.90

## 2021-08-06 HISTORY — DX: Essential (primary) hypertension: I10

## 2021-08-06 HISTORY — DX: Hyperlipidemia, unspecified: E78.5

## 2021-08-06 LAB — CBC AND DIFFERENTIAL
Absolute NRBC: 0 10*3/uL (ref 0.00–0.00)
Basophils Absolute Automated: 0.05 10*3/uL (ref 0.00–0.08)
Basophils Automated: 0.4 %
Eosinophils Absolute Automated: 0.43 10*3/uL (ref 0.00–0.44)
Eosinophils Automated: 3.8 %
Hematocrit: 37.1 % (ref 34.7–43.7)
Hgb: 12 g/dL (ref 11.4–14.8)
Immature Granulocytes Absolute: 0.07 10*3/uL (ref 0.00–0.07)
Immature Granulocytes: 0.6 %
Instrument Absolute Neutrophil Count: 8.72 10*3/uL — ABNORMAL HIGH (ref 1.10–6.33)
Lymphocytes Absolute Automated: 1.07 10*3/uL (ref 0.42–3.22)
Lymphocytes Automated: 9.4 %
MCH: 29.9 pg (ref 25.1–33.5)
MCHC: 32.3 g/dL (ref 31.5–35.8)
MCV: 92.3 fL (ref 78.0–96.0)
MPV: 9.7 fL (ref 8.9–12.5)
Monocytes Absolute Automated: 1.09 10*3/uL — ABNORMAL HIGH (ref 0.21–0.85)
Monocytes: 9.5 %
Neutrophils Absolute: 8.72 10*3/uL — ABNORMAL HIGH (ref 1.10–6.33)
Neutrophils: 76.3 %
Nucleated RBC: 0 /100 WBC (ref 0.0–0.0)
Platelets: 248 10*3/uL (ref 142–346)
RBC: 4.02 10*6/uL (ref 3.90–5.10)
RDW: 14 % (ref 11–15)
WBC: 11.43 10*3/uL — ABNORMAL HIGH (ref 3.10–9.50)

## 2021-08-06 LAB — ECG 12-LEAD
Atrial Rate: 71 {beats}/min
IHS MUSE NARRATIVE AND IMPRESSION: NORMAL
P Axis: 71 degrees
P-R Interval: 170 ms
Q-T Interval: 416 ms
QRS Duration: 100 ms
QTC Calculation (Bezet): 452 ms
R Axis: 14 degrees
T Axis: 54 degrees
Ventricular Rate: 71 {beats}/min

## 2021-08-06 LAB — COMPREHENSIVE METABOLIC PANEL
ALT: 17 U/L (ref 0–55)
AST (SGOT): 15 U/L (ref 5–41)
Albumin/Globulin Ratio: 1.5 (ref 0.9–2.2)
Albumin: 4 g/dL (ref 3.5–5.0)
Alkaline Phosphatase: 62 U/L (ref 37–117)
Anion Gap: 9 (ref 5.0–15.0)
BUN: 26 mg/dL — ABNORMAL HIGH (ref 7.0–21.0)
Bilirubin, Total: 0.5 mg/dL (ref 0.2–1.2)
CO2: 24 mEq/L (ref 17–29)
Calcium: 9.3 mg/dL (ref 7.9–10.2)
Chloride: 105 mEq/L (ref 99–111)
Creatinine: 0.9 mg/dL (ref 0.4–1.0)
Globulin: 2.6 g/dL (ref 2.0–3.6)
Glucose: 103 mg/dL — ABNORMAL HIGH (ref 70–100)
Potassium: 3.9 mEq/L (ref 3.5–5.3)
Protein, Total: 6.6 g/dL (ref 6.0–8.3)
Sodium: 138 mEq/L (ref 135–145)

## 2021-08-06 LAB — GFR: EGFR: >60

## 2021-08-06 LAB — HIGH SENSITIVITY TROPONIN-I: hs Troponin-I: 3.4 ng/L

## 2021-08-06 MED ORDER — ACETAMINOPHEN 500 MG PO TABS
1000.0000 mg | ORAL_TABLET | Freq: Once | ORAL | Status: AC
Start: 2021-08-06 — End: 2021-08-06
  Administered 2021-08-06: 1000 mg via ORAL
  Filled 2021-08-06: qty 2

## 2021-08-06 MED ORDER — SODIUM CHLORIDE 0.9 % IV BOLUS
1000.0000 mL | Freq: Once | INTRAVENOUS | Status: AC
Start: 2021-08-06 — End: 2021-08-06
  Administered 2021-08-06: 1000 mL via INTRAVENOUS

## 2021-08-06 NOTE — ED Provider Notes (Signed)
EMERGENCY DEPARTMENT HISTORY AND PHYSICAL EXAM     None        Date: 08/06/2021  Patient Name: Deborah Valencia    History of Presenting Illness     Chief Complaint   Patient presents with    Fall    Hip Pain    Head Injury         Additional History: Deborah Valencia is a 81 y.o. female with past medical history of Alzheimer's disease, hypertension presenting to the ED with fall.  Patient is coming from a nursing home where she was found on the floor in the bathroom.  She is not aware of how she fell.  Staff noted that she had bleeding from her head.  Patient complains of headache but denies any other pain at this time.  Further history is not available.      PCP: Pcp, None, MD  SPECIALISTS:    No current facility-administered medications for this encounter.     Current Outpatient Medications   Medication Sig Dispense Refill    buPROPion XL 450 MG Tablet SR 24 hr Take by mouth.      citalopram (CELEXA) 20 MG tablet Take 40 mg by mouth daily.  1    donepezil (ARICEPT) 10 MG tablet TAKE 1 TABLET BY MOUTH EVERYDAY AT BEDTIME 30 tablet 0    losartan (COZAAR) 100 MG tablet Take 100 mg by mouth daily.  1    lovastatin (MEVACOR) 20 MG tablet Take 20 mg by mouth nightly.  0    memantine (NAMENDA) 5 MG tablet TAKE 1 TABLET BY MOUTH TWICE A DAY 180 tablet 2       Past History     Past Medical History:  Past Medical History:   Diagnosis Date    Dementia     Hyperlipidemia     Hypertension        Past Surgical History:  History reviewed. No pertinent surgical history.    Family History:  History reviewed. No pertinent family history.    Social History:  Social History     Tobacco Use    Smoking status: Never    Smokeless tobacco: Never   Vaping Use    Vaping Use: Never used   Substance Use Topics    Alcohol use: Not Currently     Alcohol/week: 7.0 standard drinks     Types: 7 Glasses of wine per week    Drug use: No       Allergies:  No Known Allergies    Review of Systems   Review of Systems   Unable to perform ROS:  Dementia       Physical Exam   BP 184/82   Pulse 65   Temp 99 F (37.2 C) (Oral)   Resp 18   Wt 68.9 kg   SpO2 98%   BMI 26.07 kg/m   Physical Exam  Vitals and nursing note reviewed.   Constitutional:       General: She is not in acute distress.     Appearance: She is well-developed.      Comments: Lying with eyes closed   HENT:      Head: Normocephalic. Contusion (Right periorbital) present.      Comments: Laceration at the right temple     Nose: Nose normal.      Mouth/Throat:      Mouth: Mucous membranes are moist.   Eyes:      Extraocular Movements: Extraocular movements intact.  Conjunctiva/sclera: Conjunctivae normal.   Cardiovascular:      Rate and Rhythm: Normal rate and regular rhythm.      Heart sounds: Normal heart sounds. No murmur heard.    No friction rub. No gallop.   Pulmonary:      Effort: Pulmonary effort is normal. No respiratory distress.      Breath sounds: Normal breath sounds. No wheezing or rales.   Abdominal:      General: Abdomen is flat. There is no distension.      Palpations: Abdomen is soft. There is no mass.      Tenderness: There is no abdominal tenderness. There is no guarding or rebound.   Musculoskeletal:         General: No swelling, tenderness, deformity or signs of injury. Normal range of motion.      Cervical back: Normal range of motion and neck supple.   Skin:     General: Skin is warm and dry.      Findings: No rash.   Neurological:      General: No focal deficit present.      Mental Status: Mental status is at baseline.      Cranial Nerves: No cranial nerve deficit.   Psychiatric:         Mood and Affect: Mood normal.         Behavior: Behavior normal.         Diagnostic Study Results     Labs -     Results       Procedure Component Value Units Date/Time    High Sensitivity Troponin-I [[id number]] Collected: 08/06/21 0038    Specimen: Blood Updated: 08/06/21 0113     hs Troponin-I 3.4 ng/L     Comprehensive metabolic panel [[id number]]  (Abnormal) Collected:  08/06/21 0038    Specimen: Blood Updated: 08/06/21 0102     Glucose 103 mg/dL      BUN 78.2 mg/dL      Creatinine 0.9 mg/dL      Sodium 956 mEq/L      Potassium 3.9 mEq/L      Chloride 105 mEq/L      CO2 24 mEq/L      Calcium 9.3 mg/dL      Protein, Total 6.6 g/dL      Albumin 4.0 g/dL      AST (SGOT) 15 U/L      ALT 17 U/L      Alkaline Phosphatase 62 U/L      Bilirubin, Total 0.5 mg/dL      Globulin 2.6 g/dL      Albumin/Globulin Ratio 1.5     Anion Gap 9.0    GFR [[id number]] Collected: 08/06/21 0038     Updated: 08/06/21 0102     EGFR >60.0       CBC and differential [[id number]]  (Abnormal) Collected: 08/06/21 0038    Specimen: Blood Updated: 08/06/21 0046     WBC 11.43 x10 3/uL      Hgb 12.0 g/dL      Hematocrit 41.3 %      Platelets 248 x10 3/uL      RBC 4.02 x10 6/uL      MCV 92.3 fL      MCH 29.9 pg      MCHC 32.3 g/dL      RDW 14 %      MPV 9.7 fL      Instrument Absolute Neutrophil Count 8.72 x10 3/uL  Neutrophils 76.3 %      Lymphocytes Automated 9.4 %      Monocytes 9.5 %      Eosinophils Automated 3.8 %      Basophils Automated 0.4 %      Immature Granulocytes 0.6 %      Nucleated RBC 0.0 /100 WBC      Neutrophils Absolute 8.72 x10 3/uL      Lymphocytes Absolute Automated 1.07 x10 3/uL      Monocytes Absolute Automated 1.09 x10 3/uL      Eosinophils Absolute Automated 0.43 x10 3/uL      Basophils Absolute Automated 0.05 x10 3/uL      Immature Granulocytes Absolute 0.07 x10 3/uL      Absolute NRBC 0.00 x10 3/uL             Radiologic Studies -   Radiology Results (24 Hour)       Procedure Component Value Units Date/Time    XR Pelvis Limited [[id number]] Collected: 08/06/21 0223    Order Status: Completed Updated: 08/06/21 0227    Narrative:      HISTORY: Trauma. Right hip pain.    COMPARISON: None available.    FINDINGS:     No acute fracture or dislocation.    Degenerative changes of the lumbar spine.      Impression:         No acute abnormality.    If the patient is unable to bear weight, CT is  recommended for further  evaluation.    Johnsie Kindred, MD   08/06/2021 2:25 AM    CT Cervical Spine without Contrast [[id number]] Collected: 08/06/21 0148    Order Status: Completed Updated: 08/06/21 0153    Narrative:      HISTORY: Injury of the cervical spine.    COMPARISON: None available.    TECHNIQUE: CT of the cervical spine performed without intravenous  contrast. The following dose reduction techniques were utilized:  automated exposure control and/or adjustment of the mA and/or KV  according to patient size, and the use of an iterative reconstruction  technique.    FINDINGS:     No acute cervical spine fracture or traumatic subluxation. Vertebral  body heights are maintained. Degenerative grade 1 anterolisthesis of C3  on C4 and C5 on C6. Multilevel spondylotic and discogenic changes are  noted including endplate osteophytic spurring and disc space narrowing.  Multilevel facet arthropathy with various degrees of neural foraminal  stenosis.    No prevertebral soft tissue swelling.      Impression:         No acute cervical spine fracture.    Judd Gaudier, MD   08/06/2021 1:50 AM    CT Maxillofacial Bones [[id number]] Collected: 08/06/21 0144    Order Status: Completed Updated: 08/06/21 0150    Narrative:      HISTORY: Injury of the face.    COMPARISON: None available.    TECHNIQUE: CT of the maxillofacial bones and mandible performed without  contrast. The following dose reduction techniques were utilized:  Automated exposure control and/or adjustment of the mA and/or kV  according to patient size, and the use of iterative reconstruction  technique.    FINDINGS:     MAXILLOFACIAL BONES:  No acute facial bone fracture.    Paranasal sinuses demonstrate no significant opacification.    Moderate right periorbital soft tissue swelling. Mild forward protrusion  of the right globe. No globe deformity. No retrobulbar hematoma.    MANDIBLE:  No  acute fracture or malalignment. Dental amalgam streak artifact  limits  evaluation oral cavity.      Impression:          1. Mild right sided proptosis with periorbital soft tissue swelling. No  acute fracture.    Judd Gaudier, MD   08/06/2021 1:47 AM    CT Head without Contrast [[id number]] Collected: 08/06/21 0138    Order Status: Completed Updated: 08/06/21 0146    Narrative:      HISTORY: Trauma. Head injury.    COMPARISON: None available.    TECHNIQUE: CT of the head performed without intravenous contrast. The  following dose reduction techniques were utilized: automated exposure  control and/or adjustment of the mA and/or KV according to patient size,  and the use of an iterative reconstruction technique.    FINDINGS: Cerebral and cerebellar parenchymal volume loss. Moderate  supratentorial leukomalacia which is nonspecific however most compatible  chronic progressive ischemic changes.    No acute territorial infarct, intracranial hemorrhage, mass effect,  midline shift, tonsillar herniation or extra-axial fluid collections.     Ventricles and basilar cisterns are intact.    Mild forward protrusion of the right globe with periorbital soft tissue  swelling. No acute fracture. No significant paranasal sinus disease.      Impression:          1. No acute intracranial hemorrhage.  2. Mild right sided proptosis with periorbital soft tissue swelling.    Judd Gaudier, MD   08/06/2021 1:44 AM    Chest AP Portable [[id number]] Collected: 08/06/21 0048    Order Status: Completed Updated: 08/06/21 0052    Narrative:      HISTORY: Unwitnessed fall, headache.    COMPARISON: None available.    FINDINGS:     LUNGS: No consolidation or edema. Minimal bibasilar atelectasis.    PLEURA: No pleural effusions or pneumothorax.    HEART AND MEDIASTINUM:  No cardiac enlargement.    BONES:  Suboptimally evaluated. Degenerative changes of the thoracic  spine.      Impression:        No acute abnormality.    Judd Gaudier, MD   08/06/2021 12:50 AM        .    Medical Decision Making   I am the first  provider for this patient.    I reviewed the vital signs, available nursing notes, past medical history, past surgical history, family history and social history.    Vital Signs-Reviewed the patient's vital signs.   Patient Vitals for the past 12 hrs:   BP Temp Pulse Resp   08/06/21 0236 184/82 -- 65 --   08/06/21 0130 -- -- 64 18   08/06/21 0015 131/80 99 F (37.2 C) 69 20       EKG:  Interpreted by the EP.   Rate: 71   Rhythm: NSR   Intervals: normal  Ectopy: none   ST or T wave changes: none, no STEMI    ED Course:          Medical Decision Making: Patient with fall with extensive periorbital contusion but no retro-orbital hematoma.  CT and x-ray showed no other acute abnormalities.  Patient will be discharged with her daughter.  Lack was repaired by Hermina Staggers, PA        Diagnosis     Clinical Impression:   1. Periorbital contusion of right eye, initial encounter    2. Fall, initial encounter    3. Facial laceration, initial encounter  Treatment Plan:   ED Disposition       ED Disposition   Discharge    Condition   --    Date/Time   Wed Aug 06, 2021  2:40 AM    Comment   Kathi Ludwig discharge to home/self care.    Condition at disposition: Stable                   _______________________________    Verlee Rossetti, MD, MD    _______________________________       Verlee Rossetti, MD  08/06/21 [phone]

## 2021-08-06 NOTE — ED Triage Notes (Signed)
BIBA from [facility] at [facility] after falling and hitting her head. EMT reports she was found by nurses on the floor, unknown what time she fell. Pt reports headache and right hip pain. C- collar placed by medics. Hx of dementia. No thinners. Head lac to right eyebrow, bleeding controlled.

## 2021-08-06 NOTE — ED Notes (Signed)
Bed: B25  Expected date:   Expected time:   Means of arrival:   Comments:  Medic 440

## 2021-08-06 NOTE — Progress Notes (Signed)
Referral sent to [facility] for ED to home visit.
